# Patient Record
Sex: Female | Born: 1990 | Race: Black or African American | Hispanic: No | Marital: Single | Smoking: Former smoker
Health system: Southern US, Community
[De-identification: ages and names within clinical notes are randomized; demographics above are authoritative.]

## PROBLEM LIST (undated history)

## (undated) DIAGNOSIS — R87619 Unspecified abnormal cytological findings in specimens from cervix uteri: Secondary | ICD-10-CM

## (undated) DIAGNOSIS — A749 Chlamydial infection, unspecified: Secondary | ICD-10-CM

## (undated) DIAGNOSIS — O24419 Gestational diabetes mellitus in pregnancy, unspecified control: Secondary | ICD-10-CM

## (undated) DIAGNOSIS — IMO0002 Reserved for concepts with insufficient information to code with codable children: Secondary | ICD-10-CM

## (undated) HISTORY — DX: Gestational diabetes mellitus in pregnancy, unspecified control: O24.419

---

## 2012-04-27 ENCOUNTER — Encounter (HOSPITAL_COMMUNITY): Payer: Self-pay | Admitting: Emergency Medicine

## 2012-04-27 ENCOUNTER — Emergency Department (HOSPITAL_COMMUNITY): Payer: Self-pay

## 2012-04-27 ENCOUNTER — Emergency Department (HOSPITAL_COMMUNITY)
Admission: EM | Admit: 2012-04-27 | Discharge: 2012-04-27 | Disposition: A | Discharge status: Home Health | Payer: Self-pay | Attending: Emergency Medicine | Admitting: Emergency Medicine

## 2012-04-27 DIAGNOSIS — O9989 Other specified diseases and conditions complicating pregnancy, childbirth and the puerperium: Secondary | ICD-10-CM | POA: Insufficient documentation

## 2012-04-27 DIAGNOSIS — R1110 Vomiting, unspecified: Secondary | ICD-10-CM

## 2012-04-27 DIAGNOSIS — Z3490 Encounter for supervision of normal pregnancy, unspecified, unspecified trimester: Secondary | ICD-10-CM

## 2012-04-27 DIAGNOSIS — R109 Unspecified abdominal pain: Secondary | ICD-10-CM | POA: Insufficient documentation

## 2012-04-27 DIAGNOSIS — O219 Vomiting of pregnancy, unspecified: Secondary | ICD-10-CM | POA: Insufficient documentation

## 2012-04-27 LAB — WET PREP, GENITAL
Trich, Wet Prep: NONE SEEN
Yeast Wet Prep HPF POC: NONE SEEN

## 2012-04-27 LAB — BASIC METABOLIC PANEL
BUN: 7 mg/dL (ref 6–23)
CO2: 23 mEq/L (ref 19–32)
Calcium: 8.9 mg/dL (ref 8.4–10.5)
Chloride: 100 mEq/L (ref 96–112)
Creatinine, Ser: 0.67 mg/dL (ref 0.50–1.10)
GFR calc Af Amer: >90 mL/min (ref >90)
GFR calc non Af Amer: >90 mL/min (ref >90)
Glucose, Bld: 99 mg/dL (ref 70–99)
Potassium: 3.2 mEq/L — ABNORMAL LOW (ref 3.5–5.1)
Sodium: 136 mEq/L (ref 135–145)

## 2012-04-27 LAB — CBC
HCT: 36.7 % (ref 36.0–46.0)
Hemoglobin: 12.5 g/dL (ref 12.0–15.0)
MCH: 26.9 pg (ref 26.0–34.0)
MCHC: 34.1 g/dL (ref 30.0–36.0)
MCV: 79.1 fL (ref 78.0–100.0)
Platelets: 355 10*3/uL (ref 150–400)
RBC: 4.64 MIL/uL (ref 3.87–5.11)
RDW: 14.1 % (ref 11.5–15.5)
WBC: 10.7 10*3/uL — ABNORMAL HIGH (ref 4.0–10.5)

## 2012-04-27 LAB — URINALYSIS, ROUTINE W REFLEX MICROSCOPIC
Bilirubin Urine: NEGATIVE
Glucose, UA: NEGATIVE mg/dL
Hgb urine dipstick: NEGATIVE
Ketones, ur: 15 mg/dL — AB
Nitrite: NEGATIVE
Protein, ur: NEGATIVE mg/dL
Specific Gravity, Urine: 1.038 — ABNORMAL HIGH (ref 1.005–1.030)
Urobilinogen, UA: 1 mg/dL (ref 0.0–1.0)
pH: 7 (ref 5.0–8.0)

## 2012-04-27 LAB — URINE MICROSCOPIC-ADD ON

## 2012-04-27 LAB — POCT PREGNANCY, URINE: Preg Test, Ur: POSITIVE — AB

## 2012-04-27 MED ORDER — ONDANSETRON HCL 4 MG/2ML IJ SOLN
4.0000 mg | Freq: Once | INTRAMUSCULAR | Status: AC
  Administered 2012-04-27: 4 mg via INTRAVENOUS
  Filled 2012-04-27: qty 2

## 2012-04-27 MED ORDER — POTASSIUM CHLORIDE CRYS ER 20 MEQ PO TBCR: 40.0000 meq | EXTENDED_RELEASE_TABLET | Freq: Once | ORAL | Status: DC

## 2012-04-27 MED ORDER — PRENATAL VITAMINS 28-0.8 MG PO TABS: 1.0000 | ORAL_TABLET | Freq: Every day | ORAL | Status: DC

## 2012-04-27 MED ORDER — SODIUM CHLORIDE 0.9 % IV BOLUS (SEPSIS)
1000.0000 mL | Freq: Once | INTRAVENOUS | Status: AC
  Administered 2012-04-27: 1000 mL via INTRAVENOUS

## 2012-04-27 NOTE — ED Provider Notes (Signed)
History     CSN: [id number]  Arrival date & time 04/27/12  1301   First MD Initiated Contact with Patient 04/27/12 1620      Chief Complaint  Patient presents with  . Abdominal Pain    (Consider location/radiation/quality/duration/timing/severity/associated sxs/prior treatment) Patient is a 21 y.o. female presenting with abdominal pain. The history is provided by the patient.  Abdominal Pain The primary symptoms of the illness include abdominal pain (lower, 3 days) and vomiting (once). The primary symptoms of the illness do not include dysuria, vaginal discharge or vaginal bleeding. The current episode started more than 2 days ago. The onset of the illness was gradual. The problem has been gradually worsening.  The abdominal pain began more than 2 days ago. The pain came on gradually. The abdominal pain has been unchanged since its onset. The abdominal pain is located in the suprapubic region, RLQ and LLQ. The abdominal pain does not radiate. Relieved by: nothing. Exacerbated by: nothing.  The patient states that she believes she is currently pregnant. Symptoms associated with the illness do not include chills, urgency, hematuria, frequency or back pain.    History reviewed. No pertinent past medical history.  History reviewed. No pertinent past surgical history.  History reviewed. No pertinent family history.  History  Substance Use Topics  . Smoking status: Never Smoker   . Smokeless tobacco: Not on file  . Alcohol Use: Yes     Comment: occ    OB History    Grav Para Term Preterm Abortions TAB SAB Ect Mult Living                  Review of Systems  Constitutional: Negative for chills.  Gastrointestinal: Positive for vomiting (once) and abdominal pain (lower, 3 days).  Genitourinary: Negative for dysuria, urgency, frequency, hematuria, vaginal bleeding, vaginal discharge, difficulty urinating and vaginal pain.  Musculoskeletal: Negative for back pain.  All other  systems reviewed and are negative.    Allergies  Review of patient's allergies indicates no known allergies.  Home Medications  No current outpatient prescriptions on file.  BP 120/68  Pulse 89  Temp 98.9 F (37.2 C) (Oral)  Resp 18  SpO2 100%  Physical Exam  Nursing note and vitals reviewed. Constitutional: She is oriented to person, place, and time. She appears well-developed and well-nourished. No distress.  HENT:  Head: Normocephalic and atraumatic.  Eyes: EOM are normal. Pupils are equal, round, and reactive to light.  Neck: Normal range of motion. Neck supple.  Cardiovascular: Normal rate and regular rhythm.  Exam reveals no friction rub.   No murmur heard. Pulmonary/Chest: Effort normal and breath sounds normal. No respiratory distress. She has no wheezes. She has no rales.  Abdominal: Soft. She exhibits no distension. There is no tenderness. There is no rebound.  Genitourinary: Vagina normal. There is no rash, tenderness or lesion on the right labia. There is no rash, tenderness or lesion on the left labia. Cervix exhibits no motion tenderness, no discharge and no friability. Right adnexum displays no mass, no tenderness and no fullness. Left adnexum displays no mass, no tenderness and no fullness.  Musculoskeletal: Normal range of motion. She exhibits no edema.  Neurological: She is alert and oriented to person, place, and time.  Skin: She is not diaphoretic.    ED Course  Procedures (including critical care time)  Labs Reviewed  URINALYSIS, ROUTINE W REFLEX MICROSCOPIC - Abnormal; Notable for the following:    APPearance HAZY (*)  Specific Gravity, Urine 1.038 (*)     Ketones, ur 15 (*)     Leukocytes, UA SMALL (*)     All other components within normal limits  POCT PREGNANCY, URINE - Abnormal; Notable for the following:    Preg Test, Ur POSITIVE (*)     All other components within normal limits  URINE MICROSCOPIC-ADD ON - Abnormal; Notable for the  following:    Squamous Epithelial / LPF MANY (*)     Bacteria, UA MANY (*)     All other components within normal limits  URINE CULTURE   US Ob Comp Less 14 Wks  04/27/2012  *RADIOLOGY REPORT*  Clinical Data: Pelvic pain.  Pregnant.  OBSTETRIC <14 WK [place] AND TRANSVAGINAL OB US  Technique:  Both transabdominal and transvaginal ultrasound examinations were performed for complete evaluation of the gestation as well as the maternal uterus, adnexal regions, and pelvic cul-de-sac.  Transvaginal technique was performed to assess early pregnancy.  Comparison:  None.  Intrauterine gestational sac:  Visualized/normal in shape. Yolk sac: Present Embryo: Present Cardiac Activity: Present Heart Rate: 110 bpm  CRL: 4.1  mm  6 w  1 d        [place] EDC: 12/20/2012.  Maternal uterus/adnexae: Small subchorionic hemorrhage is present.  Physiologic appearance of the ovaries bilaterally.  Small amount of free fluid.  IMPRESSION: Single viable intrauterine pregnancy with estimated gestational age [redacted] weeks 1 day.  Small subchorionic hemorrhage.   Original Report Authenticated By: Andreas Newport, M.D.    US Ob Transvaginal  04/27/2012  *RADIOLOGY REPORT*  Clinical Data: Pelvic pain.  Pregnant.  OBSTETRIC <14 WK [place] AND TRANSVAGINAL OB US  Technique:  Both transabdominal and transvaginal ultrasound examinations were performed for complete evaluation of the gestation as well as the maternal uterus, adnexal regions, and pelvic cul-de-sac.  Transvaginal technique was performed to assess early pregnancy.  Comparison:  None.  Intrauterine gestational sac:  Visualized/normal in shape. Yolk sac: Present Embryo: Present Cardiac Activity: Present Heart Rate: 110 bpm  CRL: 4.1  mm  6 w  1 d        [place] EDC: 12/20/2012.  Maternal uterus/adnexae: Small subchorionic hemorrhage is present.  Physiologic appearance of the ovaries bilaterally.  Small amount of free fluid.  IMPRESSION: Single viable intrauterine pregnancy with estimated gestational age [redacted]  weeks 1 day.  Small subchorionic hemorrhage.   Original Report Authenticated By: Andreas Newport, M.D.      1. Abdominal  pain, other specified site   2. Pregnancy   3. Vomiting       MDM   21 year old female with no pressure presents with lower abdominal pain for several days. She's had some vomiting today. Multiple contacts have been sick with a GI illness including her cousin/roommate and her boyfriend. Patient's LMP was around November 1. Today she is pregnant on our pregnancy test. Belly is benign except for some mild lower quadrant tenderness. On pelvic exam there is no tenderness, no vaginal discharge, no adnexal tenderness. I was unable to visualize a gravid uterus with a transabdominal ultrasound probe. I will send her for a formal ultrasound to stage her pregnancy.  I believe patient's vomiting could be due to early pregnancy or the GI illness or roommate's. Patient will be given fluids and basic labs to be checked.  Her urine appears contaminated, denies dysuria at this time. Labs with mild hypokalemia - repleted PO. [place] with IUP at 6 weeks and 1 day. Patient tolerating PO well, relaxing  comfortably. Given Rx for PNV and instructions for prenatal follow. Stable for discharge.    Elwin Mocha, MD 04/27/12 [phone]

## 2012-04-27 NOTE — ED Notes (Signed)
Patient transported to Ultrasound 

## 2012-04-27 NOTE — ED Notes (Signed)
Pt c/o lower abd pain x several days; pt sts LMP was around first on November; pt sts she thinks she could be pregnant; pt denies discharge or bleeding; pt denies UTI sx

## 2012-04-27 NOTE — ED Notes (Signed)
Patient to us at this time.

## 2012-04-28 LAB — URINE CULTURE

## 2012-04-29 LAB — GC/CHLAMYDIA PROBE AMP
CT Probe RNA: POSITIVE — AB
GC Probe RNA: NEGATIVE

## 2012-04-30 NOTE — ED Notes (Signed)
+  Chlamydia. Chart sent to EDP office for review. [employer] attached. 

## 2012-05-02 NOTE — ED Provider Notes (Signed)
I saw and evaluated the patient, reviewed the resident's note and I agree with the findings and plan.  20yF with abdominal pain. 1st trimester pregnancy. On exam, NAD. Lungs clear. RRR. No murmur. Abdomen benign. Skin warm and dry. US shows IUP @[redacted]w[redacted]d . Prenatal vitamins. Ob fu. Return precautions discussed.  Raeford Razor, MD 05/02/12 0005

## 2012-05-06 ENCOUNTER — Telehealth (HOSPITAL_COMMUNITY): Payer: Self-pay | Admitting: Emergency Medicine

## 2012-05-06 NOTE — ED Notes (Signed)
Rxs called in to [facility] on [address] by Norm Parcel PFM.

## 2012-05-06 NOTE — ED Notes (Signed)
Chart returned from EDP office. Prescribed Azithromycin 1 gram PO x 1 and Metronidazole 500 mg PO BID 7 days. Prescribed by Roxy Horseman PA-C.

## 2012-05-17 NOTE — L&D Delivery Note (Signed)
Delivery Note At 8:00 PM a viable female was delivered via Vaginal, Spontaneous Delivery (Presentation: Right Occiput Anterior).  APGAR: 9, 9; weight: pending Placenta status: Intact, Spontaneous.  Cord: 3 vessels with the following complications: None.    Anesthesia: Epidural  Episiotomy: None Lacerations: Periurethral (hemostatic) Suture Repair: n/a Est. Blood Loss (mL): 150  Mother pushed for 25 mins to deliver viable female infant over intact perineum in ROA position. No nuchal cord. Shoulders and body delivered without difficulty or delay. + meconium stained fluid. Baby immediately vigorous and crying upon delivery and placed on maternal abdomen. Cord clamping delayed until umbilical cord stopped pulsing. Cord then clamped and cut by FOB.  Third stage of labor actively managed with gentle cord traction and Oxytocin. Placenta delivered intact with 3 vessel cord. No perineal lacerations. Small right periurethral laceration however it was hemostatic and no repair was required. Mother and infant stable at conclusion of delivery. Counts correct and verified. Mom to postpartum. Infant to couplet care/skin to skin.   Delivery performed by me with direct supervision by Wynelle Bourgeois, CNM  C[place]art, Ryann 03/30/2013, 8:29 PM  Attended by me also Agree with note Aviva Signs, CNM

## 2012-08-22 ENCOUNTER — Encounter (HOSPITAL_COMMUNITY): Payer: Self-pay | Admitting: Physical Medicine and Rehabilitation

## 2012-08-22 ENCOUNTER — Emergency Department (HOSPITAL_COMMUNITY)
Admission: EM | Admit: 2012-08-22 | Discharge: 2012-08-22 | Disposition: A | Discharge status: Home / Self Care | Payer: Medicaid Other | Attending: Emergency Medicine | Admitting: Emergency Medicine

## 2012-08-22 ENCOUNTER — Emergency Department (HOSPITAL_COMMUNITY): Payer: Medicaid Other

## 2012-08-22 DIAGNOSIS — R350 Frequency of micturition: Secondary | ICD-10-CM | POA: Insufficient documentation

## 2012-08-22 DIAGNOSIS — R197 Diarrhea, unspecified: Secondary | ICD-10-CM | POA: Insufficient documentation

## 2012-08-22 DIAGNOSIS — R109 Unspecified abdominal pain: Secondary | ICD-10-CM

## 2012-08-22 DIAGNOSIS — R82998 Other abnormal findings in urine: Secondary | ICD-10-CM | POA: Insufficient documentation

## 2012-08-22 DIAGNOSIS — Z3490 Encounter for supervision of normal pregnancy, unspecified, unspecified trimester: Secondary | ICD-10-CM

## 2012-08-22 DIAGNOSIS — O219 Vomiting of pregnancy, unspecified: Secondary | ICD-10-CM | POA: Insufficient documentation

## 2012-08-22 DIAGNOSIS — O9989 Other specified diseases and conditions complicating pregnancy, childbirth and the puerperium: Secondary | ICD-10-CM | POA: Insufficient documentation

## 2012-08-22 DIAGNOSIS — R8271 Bacteriuria: Secondary | ICD-10-CM

## 2012-08-22 DIAGNOSIS — R1110 Vomiting, unspecified: Secondary | ICD-10-CM

## 2012-08-22 DIAGNOSIS — M436 Torticollis: Secondary | ICD-10-CM | POA: Insufficient documentation

## 2012-08-22 DIAGNOSIS — R1013 Epigastric pain: Secondary | ICD-10-CM | POA: Insufficient documentation

## 2012-08-22 LAB — COMPREHENSIVE METABOLIC PANEL
ALT: 7 U/L (ref 0–35)
AST: 14 U/L (ref 0–37)
Albumin: 3.3 g/dL — ABNORMAL LOW (ref 3.5–5.2)
CO2: 23 mEq/L (ref 19–32)
Calcium: 9.1 mg/dL (ref 8.4–10.5)
Chloride: 100 mEq/L (ref 96–112)
GFR calc non Af Amer: >90 mL/min (ref >90)
Sodium: 134 mEq/L — ABNORMAL LOW (ref 135–145)

## 2012-08-22 LAB — CBC WITH DIFFERENTIAL/PLATELET
Basophils Absolute: 0 10*3/uL (ref 0.0–0.1)
Basophils Relative: 0 % (ref 0–1)
Eosinophils Relative: 1 % (ref 0–5)
Lymphocytes Relative: 23 % (ref 12–46)
MCHC: 34.5 g/dL (ref 30.0–36.0)
Neutro Abs: 6.7 10*3/uL (ref 1.7–7.7)
Platelets: 342 10*3/uL (ref 150–400)
RDW: 14.4 % (ref 11.5–15.5)
WBC: 9.4 10*3/uL (ref 4.0–10.5)

## 2012-08-22 LAB — URINALYSIS, ROUTINE W REFLEX MICROSCOPIC
Bilirubin Urine: NEGATIVE
Glucose, UA: NEGATIVE mg/dL
Hgb urine dipstick: NEGATIVE
Specific Gravity, Urine: 1.033 — ABNORMAL HIGH (ref 1.005–1.030)
Urobilinogen, UA: 1 mg/dL (ref 0.0–1.0)

## 2012-08-22 LAB — WET PREP, GENITAL
Clue Cells Wet Prep HPF POC: NONE SEEN
Trich, Wet Prep: NONE SEEN

## 2012-08-22 LAB — URINE MICROSCOPIC-ADD ON

## 2012-08-22 LAB — LIPASE, BLOOD: Lipase: 37 U/L (ref 11–59)

## 2012-08-22 MED ORDER — LIDOCAINE HCL (PF) 1 % IJ SOLN
1.0000 mL | Freq: Once | INTRAMUSCULAR | Status: AC
  Administered 2012-08-22: 1 mL

## 2012-08-22 MED ORDER — AZITHROMYCIN 1 G PO PACK
1.0000 g | PACK | Freq: Once | ORAL | Status: AC
  Administered 2012-08-22: 1 g via ORAL
  Filled 2012-08-22: qty 1

## 2012-08-22 MED ORDER — CEPHALEXIN 500 MG PO CAPS: 500.0000 mg | ORAL_CAPSULE | Freq: Two times a day (BID) | ORAL | Status: DC

## 2012-08-22 MED ORDER — CEFTRIAXONE SODIUM 250 MG IJ SOLR
250.0000 mg | Freq: Once | INTRAMUSCULAR | Status: AC
  Administered 2012-08-22: 250 mg via INTRAMUSCULAR
  Filled 2012-08-22: qty 250

## 2012-08-22 MED ORDER — LIDOCAINE HCL (PF) 1 % IJ SOLN
INTRAMUSCULAR | Status: AC
  Filled 2012-08-22: qty 5

## 2012-08-22 NOTE — ED Notes (Signed)
Pt presents to department for evaluation of diffuse abdominal pain, and N/V/D. Ongoing x3 days. 7/10 pain at the time. Also states vaginal discharge and urinary frequency. Pt is alert and oriented x4. No signs of distress noted.

## 2012-08-22 NOTE — ED Provider Notes (Signed)
I saw and evaluated the patient, reviewed the resident's note and I agree with the findings and plan. On my exam this female with her second pregnancy, no prenatal evaluation thus far is in no distress.  Gerhard Munch, MD 08/22/12 1525

## 2012-08-22 NOTE — ED Provider Notes (Signed)
History     CSN: [id number]  Arrival date & time 08/22/12  4098   First MD Initiated Contact with Patient 08/22/12 [phone]      Chief Complaint  Patient presents with  . Nausea  . Emesis  . Abdominal Pain    (Consider location/radiation/quality/duration/timing/severity/associated sxs/prior treatment) Patient is a 22 y.o. female presenting with abdominal pain.  Abdominal Pain Pain location:  Epigastric Pain quality: aching   Pain radiates to:  RUQ Pain severity:  Moderate Onset quality:  Gradual Duration:  3 days Timing:  Constant Progression:  Worsening Chronicity:  New Context: not eating   Relieved by:  Eating Ineffective treatments:  Acetaminophen and NSAIDs Associated symptoms: diarrhea (now improving), nausea, vaginal discharge and vomiting   Associated symptoms: no chest pain, no cough, no fever and no shortness of breath   Vaginal discharge:    Quality:  White   Duration: started this morning. Vomiting:    Number of occurrences:  3 times per day   No past medical history on file.  No past surgical history on file.  No family history on file.  History  Substance Use Topics  . Smoking status: Never Smoker   . Smokeless tobacco: Not on file  . Alcohol Use: Yes     Comment: occ    OB History   Grav Para Term Preterm Abortions TAB SAB Ect Mult Living                  Review of Systems  Constitutional: Negative for fever.  HENT: Negative for congestion.   Respiratory: Negative for cough and shortness of breath.   Cardiovascular: Negative for chest pain.  Gastrointestinal: Positive for nausea, vomiting, abdominal pain and diarrhea (now improving).  Genitourinary: Positive for frequency and vaginal discharge. Negative for difficulty urinating, vaginal pain and pelvic pain.  All other systems reviewed and are negative.    Allergies  Review of patient's allergies indicates no known allergies.  Home Medications  No current outpatient prescriptions on  file.  BP 130/74  Pulse 82  Temp(Src) 98.1 F (36.7 C) (Oral)  SpO2 99%  Physical Exam  Nursing note and vitals reviewed. Constitutional: She is oriented to person, place, and time. She appears well-developed and well-nourished. No distress.  HENT:  Head: Normocephalic and atraumatic.  Mouth/Throat: Oropharynx is clear and moist.  Eyes: Conjunctivae are normal. Pupils are equal, round, and reactive to light. No scleral icterus.  Neck: Neck supple.  Cardiovascular: Normal rate, regular rhythm, normal heart sounds and intact distal pulses.   No murmur heard. Pulmonary/Chest: Effort normal and breath sounds normal. No stridor. No respiratory distress. She has no rales.  Abdominal: Soft. Bowel sounds are normal. She exhibits no distension. There is tenderness in the right upper quadrant and epigastric area. There is no rigidity, no rebound, no guarding and no CVA tenderness.  Genitourinary: There is no lesion on the right labia. There is no lesion on the left labia. Uterus is not tender. Cervix exhibits discharge (scant white). Cervix exhibits no motion tenderness and no friability. Right adnexum displays tenderness. Right adnexum displays no mass. Left adnexum displays tenderness. Left adnexum displays no mass.  Musculoskeletal: Normal range of motion.  Neurological: She is alert and oriented to person, place, and time.  Skin: Skin is warm and dry. No rash noted.  Psychiatric: She has a normal mood and affect. Her behavior is normal.    ED Course  Procedures (including critical care time)  Labs Reviewed  CBC  WITH DIFFERENTIAL - Abnormal; Notable for the following:    MCV 77.4 (*)    All other components within normal limits  COMPREHENSIVE METABOLIC PANEL - Abnormal; Notable for the following:    Sodium 134 (*)    Glucose, Bld 134 (*)    Albumin 3.3 (*)    All other components within normal limits  URINALYSIS, ROUTINE W REFLEX MICROSCOPIC - Abnormal; Notable for the following:     APPearance CLOUDY (*)    Specific Gravity, Urine 1.033 (*)    Leukocytes, UA MODERATE (*)    All other components within normal limits  URINE MICROSCOPIC-ADD ON - Abnormal; Notable for the following:    Squamous Epithelial / LPF MANY (*)    Bacteria, UA FEW (*)    All other components within normal limits  POCT PREGNANCY, URINE - Abnormal; Notable for the following:    Preg Test, Ur POSITIVE (*)    All other components within normal limits  GC/CHLAMYDIA PROBE AMP  WET PREP, GENITAL  URINE CULTURE  LIPASE, BLOOD   US Ob Comp Less 14 Wks  08/22/2012  *RADIOLOGY REPORT*  Clinical Data: Early OB and lower abdominal pain.  OBSTETRIC <14 WK [place] AND TRANSVAGINAL OB US  Technique:  Both transabdominal and transvaginal ultrasound examinations were performed for complete evaluation of the gestation as well as the maternal uterus, adnexal regions, and pelvic cul-de-sac.  Transvaginal technique was performed to assess early pregnancy.  Comparison:  04/27/2012  Intrauterine gestational sac:  Visualized/normal in shape. Yolk sac: Present Embryo: Present Cardiac Activity: Present Heart Rate: 163 bpm CRL: 17.9  mm  8 w  2 d             [place] EDC: 04/01/2013  Maternal uterus/adnexae: Normal appearance of both ovaries. There may be a corpus luteum on the left, measuring up to 1.8 cm.  The patient has a retroflexed uterus.  There is suggestion for a small subchorionic hemorrhage. No free fluid.  IMPRESSION: Single live intrauterine pregnancy. Calculated gestational age by ultrasound is 8 weeks and 2 days.   Original Report Authenticated By: Richarda Overlie, M.D.    US Ob Transvaginal  08/22/2012  *RADIOLOGY REPORT*  Clinical Data: Early OB and lower abdominal pain.  OBSTETRIC <14 WK [place] AND TRANSVAGINAL OB US  Technique:  Both transabdominal and transvaginal ultrasound examinations were performed for complete evaluation of the gestation as well as the maternal uterus, adnexal regions, and pelvic cul-de-sac.  Transvaginal  technique was performed to assess early pregnancy.  Comparison:  04/27/2012  Intrauterine gestational sac:  Visualized/normal in shape. Yolk sac: Present Embryo: Present Cardiac Activity: Present Heart Rate: 163 bpm CRL: 17.9  mm  8 w  2 d             [place] EDC: 04/01/2013  Maternal uterus/adnexae: Normal appearance of both ovaries. There may be a corpus luteum on the left, measuring up to 1.8 cm.  The patient has a retroflexed uterus.  There is suggestion for a small subchorionic hemorrhage. No free fluid.  IMPRESSION: Single live intrauterine pregnancy. Calculated gestational age by ultrasound is 8 weeks and 2 days.   Original Report Authenticated By: Richarda Overlie, M.D.      1. Pregnancy   2. Emesis   3. Bacteriuria   4. Abdominal pain       MDM   22 yo female presenting with epigastric abdominal pain and vomiting for 3 days.  Vaginal discharge just started today.  No pelvic pain.  Well appearing, abd soft, tender in epigastrium and RUQ.  Unlikely biliary (gets better with food and epigastric focus).  Unlikely Fitz-Hugh-Curtis based on well appearance, abdominal exam, and timeline of illness.    9:48 AM Pt has positive pregnancy test. On further history, she was seen here 4.5 months ago, at which time she was found to be pregnant.  She subsequently miscarried, and received her obstetrical care at the [facility].  She recalls being put on an antibiotic at this time, but is unsure of the name and did not complete course secondary to GI upset.  Of note, she had a positive chlamydia test back in December, but states she was unaware of these results.  Unclear whether this is what she was being treated for with the antibiotics aforementioned.  Pelvic exam is not consistent with PID, but plan to treat for GC/Chlamydia given her pregnancy, recent positive test, current discharge, and unclear prior treatment.  Also plan formal obstetrical ultrasound to rule out ectopic, given her previous STI's and  her adnexal tenderness.    12:33 PM US shows live intrauterine pregnancy.  Will dc with keflex for bacteriuria and pyuria.      Rennis Petty, MD 08/22/12 1414

## 2012-08-22 NOTE — ED Notes (Signed)
Pt transported to ultrasound.

## 2012-08-23 LAB — GC/CHLAMYDIA PROBE AMP
CT Probe RNA: NEGATIVE
GC Probe RNA: NEGATIVE

## 2012-08-23 LAB — URINE CULTURE: Colony Count: 35000

## 2012-11-20 LAB — OB RESULTS CONSOLE ANTIBODY SCREEN: Antibody Screen: NEGATIVE

## 2012-11-20 LAB — OB RESULTS CONSOLE ABO/RH

## 2012-12-04 LAB — OB RESULTS CONSOLE RUBELLA ANTIBODY, IGM: Rubella: IMMUNE

## 2012-12-04 LAB — OB RESULTS CONSOLE HEPATITIS B SURFACE ANTIGEN: Hepatitis B Surface Ag: NEGATIVE

## 2012-12-04 LAB — OB RESULTS CONSOLE HIV ANTIBODY (ROUTINE TESTING): HIV: NONREACTIVE

## 2012-12-04 LAB — OB RESULTS CONSOLE RPR: RPR: NONREACTIVE

## 2013-03-12 ENCOUNTER — Encounter: Payer: Self-pay | Admitting: Obstetrics and Gynecology

## 2013-03-12 ENCOUNTER — Ambulatory Visit (INDEPENDENT_AMBULATORY_CARE_PROVIDER_SITE_OTHER): Payer: Medicaid Other | Admitting: Obstetrics and Gynecology

## 2013-03-12 ENCOUNTER — Encounter: Payer: Medicaid Other | Attending: Obstetrics and Gynecology | Admitting: *Deleted

## 2013-03-12 VITALS — BP 123/81 | Temp 98.7°F | Ht 67.0 in | Wt 251.7 lb

## 2013-03-12 DIAGNOSIS — Z713 Dietary counseling and surveillance: Secondary | ICD-10-CM | POA: Insufficient documentation

## 2013-03-12 DIAGNOSIS — O24419 Gestational diabetes mellitus in pregnancy, unspecified control: Secondary | ICD-10-CM

## 2013-03-12 DIAGNOSIS — O0993 Supervision of high risk pregnancy, unspecified, third trimester: Secondary | ICD-10-CM

## 2013-03-12 DIAGNOSIS — Z23 Encounter for immunization: Secondary | ICD-10-CM

## 2013-03-12 DIAGNOSIS — O99810 Abnormal glucose complicating pregnancy: Secondary | ICD-10-CM

## 2013-03-12 LAB — POCT URINALYSIS DIP (DEVICE)
Hgb urine dipstick: NEGATIVE
Nitrite: NEGATIVE
Protein, ur: NEGATIVE mg/dL
pH: 6.5 (ref 5.0–8.0)

## 2013-03-12 MED ORDER — GLUCOSE BLOOD VI STRP: ORAL_STRIP | Status: DC

## 2013-03-12 MED ORDER — ACCU-CHEK NANO SMARTVIEW W/DEVICE KIT: 1.0000 | PACK | Freq: Four times a day (QID) | Status: DC

## 2013-03-12 MED ORDER — TETANUS-DIPHTH-ACELL PERTUSSIS 5-2.5-18.5 LF-MCG/0.5 IM SUSP
0.5000 mL | Freq: Once | INTRAMUSCULAR | Status: AC
  Administered 2013-03-12: 0.5 mL via INTRAMUSCULAR

## 2013-03-12 MED ORDER — ACCU-CHEK FASTCLIX LANCETS MISC: 1.0000 [IU] | Freq: Four times a day (QID) | Status: DC

## 2013-03-12 NOTE — Progress Notes (Signed)
DIABETES:  Patient presents as new pt to clinic and new DX GDM  Patient was seen on 03/12/13 for Gestational Diabetes self-management education. The following learning objectives were met by the patient during this visit:   States the definition of Gestational Diabetes  States why dietary management is important in controlling blood glucose  States when to check blood glucose levels  Demonstrates proper blood glucose monitoring techniques  States the effect of stress and exercise on blood glucose levels  States the importance of limiting caffeine and abstaining from alcohol and smoking Plan:  Consider  increasing your activity level by walking daily as tolerated Begin checking BG before breakfast and 1-2 hours after first bite of breakfast, lunch and dinner after  as directed by MD  Take medication  as directed by MD  Blood glucose monitor given: Accu Chek Nano BG Monitoring Kit Lot # [id number] Exp: 04/15/14 Blood glucose reading: 116  Patient instructed to monitor glucose levels: FBS: 60 - <90 1 hour: <140 2 hour: <120  Patient will be seen for follow-up as needed.

## 2013-03-12 NOTE — Progress Notes (Signed)
Pulse- 79  Pressure-"top of vagina when I trying to get out of bed or stand up" Weight gain of 11-20lb Flu and tdap vaccine consented and info given

## 2013-03-12 NOTE — Progress Notes (Signed)
Ultrasound scheduled for 03/14/13 at 8:45 am.

## 2013-03-12 NOTE — Addendum Note (Signed)
Addended by: Franchot Mimes on: 03/12/2013 01:34 PM   Modules accepted: Orders

## 2013-03-12 NOTE — Progress Notes (Signed)
Patient transferred from HD secondary to diagnosis of GDM. No records are available for review. Patient states onset of care with HD at 3 months, never missed any appointment. States she took glucola test twice and failed 3 hr test. Patient without complaints today.  Will start diabetic teaching. Will schedule growth ultrasound at 38 weeks. Cultures collected today

## 2013-03-12 NOTE — Progress Notes (Signed)
Nutrition note: 1st visit consult & GDM diet education Pt has h/o obesity & is a newly diagnosed GDM pt Pt has gained 13.7# @ [redacted]w[redacted]d, which is wnl. Pt reports eating 3 meals & 2-4 snacks/d. Pt is taking PNV. Pt reports no N/V or heartburn. NKFA. Pt reports walking daily. Pt received verbal & written education on GDM diet.  Discussed wt gain goals of 11-20# or 0.5#/wk. Pt agrees to follow GDM diet with 3 meals & 3 snacks/d with proper CHO/ protein combination. Pt has WIC and plans to BF. F/u in 2-4 wks Blondell Reveal, MS, RD, LDN

## 2013-03-13 LAB — PRESCRIPTION MONITORING PROFILE (19 PANEL)
Amphetamine/Meth: NEGATIVE ng/mL
Benzodiazepine Screen, Urine: NEGATIVE ng/mL
Buprenorphine, Urine: NEGATIVE ng/mL
Carisoprodol, Urine: NEGATIVE ng/mL
MDMA URINE: NEGATIVE ng/mL
Meperidine, Ur: NEGATIVE ng/mL
Methadone Screen, Urine: NEGATIVE ng/mL
Nitrites, Initial: NEGATIVE ug/mL
Propoxyphene: NEGATIVE ng/mL
Tapentadol, urine: NEGATIVE ng/mL
Tramadol Scrn, Ur: NEGATIVE ng/mL
pH, Initial: 6.7 pH (ref 4.5–8.9)

## 2013-03-13 LAB — GC/CHLAMYDIA PROBE AMP
CT Probe RNA: NEGATIVE
GC Probe RNA: NEGATIVE

## 2013-03-13 LAB — ALCOHOL METABOLITE (ETG), URINE: Ethyl Glucuronide (EtG): NEGATIVE ng/mL

## 2013-03-14 ENCOUNTER — Other Ambulatory Visit: Payer: Self-pay | Admitting: Obstetrics and Gynecology

## 2013-03-14 ENCOUNTER — Encounter: Payer: Self-pay | Admitting: *Deleted

## 2013-03-14 ENCOUNTER — Ambulatory Visit (HOSPITAL_COMMUNITY)
Admission: RE | Admit: 2013-03-14 | Discharge: 2013-03-14 | Disposition: A | Discharge status: Home / Self Care | Payer: Medicaid Other | Source: Ambulatory Visit | Attending: Obstetrics and Gynecology | Admitting: Obstetrics and Gynecology

## 2013-03-14 DIAGNOSIS — O0930 Supervision of pregnancy with insufficient antenatal care, unspecified trimester: Secondary | ICD-10-CM | POA: Insufficient documentation

## 2013-03-14 DIAGNOSIS — O99810 Abnormal glucose complicating pregnancy: Secondary | ICD-10-CM | POA: Insufficient documentation

## 2013-03-14 DIAGNOSIS — O0993 Supervision of high risk pregnancy, unspecified, third trimester: Secondary | ICD-10-CM

## 2013-03-14 DIAGNOSIS — Z3689 Encounter for other specified antenatal screening: Secondary | ICD-10-CM | POA: Insufficient documentation

## 2013-03-16 ENCOUNTER — Encounter: Payer: Self-pay | Admitting: Obstetrics and Gynecology

## 2013-03-16 DIAGNOSIS — O99820 Streptococcus B carrier state complicating pregnancy: Secondary | ICD-10-CM | POA: Insufficient documentation

## 2013-03-18 ENCOUNTER — Encounter: Payer: Self-pay | Admitting: Obstetrics and Gynecology

## 2013-03-19 ENCOUNTER — Ambulatory Visit (INDEPENDENT_AMBULATORY_CARE_PROVIDER_SITE_OTHER): Payer: Medicaid Other | Admitting: Obstetrics & Gynecology

## 2013-03-19 VITALS — BP 119/83 | Temp 97.6°F | Wt 251.9 lb

## 2013-03-19 DIAGNOSIS — O0993 Supervision of high risk pregnancy, unspecified, third trimester: Secondary | ICD-10-CM

## 2013-03-19 DIAGNOSIS — O99820 Streptococcus B carrier state complicating pregnancy: Secondary | ICD-10-CM

## 2013-03-19 DIAGNOSIS — O24419 Gestational diabetes mellitus in pregnancy, unspecified control: Secondary | ICD-10-CM

## 2013-03-19 DIAGNOSIS — Z22330 Carrier of Group B streptococcus: Secondary | ICD-10-CM

## 2013-03-19 DIAGNOSIS — O09899 Supervision of other high risk pregnancies, unspecified trimester: Secondary | ICD-10-CM

## 2013-03-19 DIAGNOSIS — O99810 Abnormal glucose complicating pregnancy: Secondary | ICD-10-CM

## 2013-03-19 LAB — POCT URINALYSIS DIP (DEVICE)
Bilirubin Urine: NEGATIVE
Glucose, UA: NEGATIVE mg/dL
Nitrite: NEGATIVE
Urobilinogen, UA: 0.2 mg/dL (ref 0.0–1.0)

## 2013-03-19 NOTE — Progress Notes (Signed)
Fastings CBG 92-97; 2 hr PP 93,116, 140 L 108, 104, 106, dinner not recorded. Scant values.  Emphasized importance of checking and recording CBG to decrease maternal-fetal morbidity and mortality.   U/S on 03/14/13  [redacted]w[redacted]d EFW 2646 g (5-13) /24%, AFI 12.86, cephalic.  No other complaints or concerns.  Fetal movement and labor precautions reviewed.

## 2013-03-19 NOTE — Patient Instructions (Signed)
Return to clinic for any obstetric concerns or go to [facility] for evaluation  

## 2013-03-19 NOTE — Progress Notes (Signed)
P= 94 Pt. C/o of pelvic pressure especially at night.

## 2013-03-26 ENCOUNTER — Encounter: Payer: Medicaid Other | Admitting: Family Medicine

## 2013-03-29 ENCOUNTER — Encounter: Payer: Self-pay | Admitting: Obstetrics and Gynecology

## 2013-03-29 ENCOUNTER — Ambulatory Visit (INDEPENDENT_AMBULATORY_CARE_PROVIDER_SITE_OTHER): Payer: Medicaid Other | Admitting: Obstetrics and Gynecology

## 2013-03-29 ENCOUNTER — Telehealth (HOSPITAL_COMMUNITY): Payer: Self-pay | Admitting: *Deleted

## 2013-03-29 VITALS — BP 124/85 | Temp 97.3°F | Wt 258.3 lb

## 2013-03-29 DIAGNOSIS — O24419 Gestational diabetes mellitus in pregnancy, unspecified control: Secondary | ICD-10-CM

## 2013-03-29 DIAGNOSIS — O0993 Supervision of high risk pregnancy, unspecified, third trimester: Secondary | ICD-10-CM

## 2013-03-29 DIAGNOSIS — O99810 Abnormal glucose complicating pregnancy: Secondary | ICD-10-CM

## 2013-03-29 DIAGNOSIS — O99820 Streptococcus B carrier state complicating pregnancy: Secondary | ICD-10-CM

## 2013-03-29 DIAGNOSIS — O09899 Supervision of other high risk pregnancies, unspecified trimester: Secondary | ICD-10-CM

## 2013-03-29 DIAGNOSIS — Z22330 Carrier of Group B streptococcus: Secondary | ICD-10-CM

## 2013-03-29 LAB — POCT URINALYSIS DIP (DEVICE)
Bilirubin Urine: NEGATIVE
Glucose, UA: NEGATIVE mg/dL
Ketones, ur: NEGATIVE mg/dL
Nitrite: NEGATIVE

## 2013-03-29 NOTE — Telephone Encounter (Signed)
Preadmission screen  

## 2013-03-29 NOTE — Progress Notes (Signed)
Pulse- 102 Patient reports pelvic pressure

## 2013-03-29 NOTE — Progress Notes (Signed)
Patient doing well without complaints. FM/Labor precautions reviewed. Will schedule IOL on 11/16 at 40 weeks.  Fasting 92-97   2 hr pp 101-140 (2 values out of range). Patient admits to eating in the middle of the night. Advised to consume protein rich snacks to prevent nighttime eating and help with fasting CBGs

## 2013-03-29 NOTE — Addendum Note (Signed)
Addended by: Jill Side on: 03/29/2013 03:07 PM   Modules accepted: Orders

## 2013-03-29 NOTE — Progress Notes (Signed)
IOL scheduled 04/01/13 at 730 am.

## 2013-03-30 ENCOUNTER — Encounter (HOSPITAL_COMMUNITY): Payer: Medicaid Other | Admitting: Anesthesiology

## 2013-03-30 ENCOUNTER — Encounter: Payer: Self-pay | Admitting: *Deleted

## 2013-03-30 ENCOUNTER — Inpatient Hospital Stay (HOSPITAL_COMMUNITY): Payer: Medicaid Other | Admitting: Anesthesiology

## 2013-03-30 ENCOUNTER — Encounter (HOSPITAL_COMMUNITY): Payer: Self-pay | Admitting: *Deleted

## 2013-03-30 ENCOUNTER — Inpatient Hospital Stay (HOSPITAL_COMMUNITY)
Admission: AD | Admit: 2013-03-30 | Discharge: 2013-04-01 | DRG: 775 | Disposition: A | Discharge status: Home / Self Care | Payer: Medicaid Other | Source: Ambulatory Visit | Attending: Obstetrics and Gynecology | Admitting: Obstetrics and Gynecology

## 2013-03-30 DIAGNOSIS — O99892 Other specified diseases and conditions complicating childbirth: Secondary | ICD-10-CM | POA: Diagnosis present

## 2013-03-30 DIAGNOSIS — O99814 Abnormal glucose complicating childbirth: Principal | ICD-10-CM | POA: Diagnosis present

## 2013-03-30 DIAGNOSIS — O9989 Other specified diseases and conditions complicating pregnancy, childbirth and the puerperium: Secondary | ICD-10-CM

## 2013-03-30 DIAGNOSIS — Z22330 Carrier of Group B streptococcus: Secondary | ICD-10-CM

## 2013-03-30 HISTORY — DX: Reserved for concepts with insufficient information to code with codable children: IMO0002

## 2013-03-30 HISTORY — DX: Chlamydial infection, unspecified: A74.9

## 2013-03-30 HISTORY — DX: Unspecified abnormal cytological findings in specimens from cervix uteri: R87.619

## 2013-03-30 LAB — CBC
HCT: 37.9 % (ref 36.0–46.0)
Hemoglobin: 13 g/dL (ref 12.0–15.0)
MCH: 26.5 pg (ref 26.0–34.0)
MCV: 77.2 fL — ABNORMAL LOW (ref 78.0–100.0)
RBC: 4.91 MIL/uL (ref 3.87–5.11)
RDW: 14.7 % (ref 11.5–15.5)

## 2013-03-30 LAB — GLUCOSE, CAPILLARY: Glucose-Capillary: 129 mg/dL — ABNORMAL HIGH (ref 70–99)

## 2013-03-30 LAB — TYPE AND SCREEN: Antibody Screen: NEGATIVE

## 2013-03-30 LAB — ABO/RH: ABO/RH(D): O POS

## 2013-03-30 MED ORDER — DIPHENHYDRAMINE HCL 50 MG/ML IJ SOLN: 12.5000 mg | INTRAMUSCULAR | Status: DC | PRN

## 2013-03-30 MED ORDER — IBUPROFEN 600 MG PO TABS
600.0000 mg | ORAL_TABLET | Freq: Four times a day (QID) | ORAL | Status: DC
  Administered 2013-03-30 – 2013-04-01 (×6): 600 mg via ORAL
  Filled 2013-03-30 (×6): qty 1

## 2013-03-30 MED ORDER — TETANUS-DIPHTH-ACELL PERTUSSIS 5-2.5-18.5 LF-MCG/0.5 IM SUSP: 0.5000 mL | Freq: Once | INTRAMUSCULAR | Status: DC

## 2013-03-30 MED ORDER — EPHEDRINE 5 MG/ML INJ
10.0000 mg | INTRAVENOUS | Status: DC | PRN
  Filled 2013-03-30: qty 2

## 2013-03-30 MED ORDER — SENNOSIDES-DOCUSATE SODIUM 8.6-50 MG PO TABS
2.0000 | ORAL_TABLET | ORAL | Status: DC
  Administered 2013-03-30 – 2013-04-01 (×2): 2 via ORAL
  Filled 2013-03-30 (×2): qty 2

## 2013-03-30 MED ORDER — DIBUCAINE 1 % RE OINT: 1.0000 "application " | TOPICAL_OINTMENT | RECTAL | Status: DC | PRN

## 2013-03-30 MED ORDER — EPHEDRINE 5 MG/ML INJ
10.0000 mg | INTRAVENOUS | Status: DC | PRN
  Filled 2013-03-30: qty 4
  Filled 2013-03-30: qty 2

## 2013-03-30 MED ORDER — LACTATED RINGERS IV SOLN
INTRAVENOUS | Status: DC
  Administered 2013-03-30 (×2): via INTRAVENOUS

## 2013-03-30 MED ORDER — PHENYLEPHRINE 40 MCG/ML (10ML) SYRINGE FOR IV PUSH (FOR BLOOD PRESSURE SUPPORT)
80.0000 ug | PREFILLED_SYRINGE | INTRAVENOUS | Status: DC | PRN
  Filled 2013-03-30: qty 2

## 2013-03-30 MED ORDER — PRENATAL MULTIVITAMIN CH
1.0000 | ORAL_TABLET | Freq: Every day | ORAL | Status: DC
  Administered 2013-03-31: 1 via ORAL
  Filled 2013-03-30: qty 1

## 2013-03-30 MED ORDER — PENICILLIN G POTASSIUM 5000000 UNITS IJ SOLR
5.0000 10*6.[IU] | Freq: Once | INTRAVENOUS | Status: AC
  Administered 2013-03-30: 5 10*6.[IU] via INTRAVENOUS
  Filled 2013-03-30: qty 5

## 2013-03-30 MED ORDER — OXYTOCIN BOLUS FROM INFUSION: 500.0000 mL | INTRAVENOUS | Status: DC

## 2013-03-30 MED ORDER — LIDOCAINE HCL (PF) 1 % IJ SOLN
INTRAMUSCULAR | Status: DC | PRN
  Administered 2013-03-30 (×2): 4 mL

## 2013-03-30 MED ORDER — FENTANYL 2.5 MCG/ML BUPIVACAINE 1/10 % EPIDURAL INFUSION (WH - ANES)
INTRAMUSCULAR | Status: DC | PRN
  Administered 2013-03-30: 14 mL/h via EPIDURAL

## 2013-03-30 MED ORDER — ONDANSETRON HCL 4 MG/2ML IJ SOLN: 4.0000 mg | INTRAMUSCULAR | Status: DC | PRN

## 2013-03-30 MED ORDER — LACTATED RINGERS IV SOLN
500.0000 mL | Freq: Once | INTRAVENOUS | Status: AC
  Administered 2013-03-30: 500 mL via INTRAVENOUS

## 2013-03-30 MED ORDER — OXYCODONE-ACETAMINOPHEN 5-325 MG PO TABS: 1.0000 | ORAL_TABLET | ORAL | Status: DC | PRN

## 2013-03-30 MED ORDER — LIDOCAINE HCL (PF) 1 % IJ SOLN
30.0000 mL | INTRAMUSCULAR | Status: DC | PRN
  Filled 2013-03-30 (×2): qty 30

## 2013-03-30 MED ORDER — IBUPROFEN 600 MG PO TABS: 600.0000 mg | ORAL_TABLET | Freq: Four times a day (QID) | ORAL | Status: DC | PRN

## 2013-03-30 MED ORDER — LANOLIN HYDROUS EX OINT: TOPICAL_OINTMENT | CUTANEOUS | Status: DC | PRN

## 2013-03-30 MED ORDER — DIPHENHYDRAMINE HCL 25 MG PO CAPS: 25.0000 mg | ORAL_CAPSULE | Freq: Four times a day (QID) | ORAL | Status: DC | PRN

## 2013-03-30 MED ORDER — ZOLPIDEM TARTRATE 5 MG PO TABS: 5.0000 mg | ORAL_TABLET | Freq: Every evening | ORAL | Status: DC | PRN

## 2013-03-30 MED ORDER — FENTANYL 2.5 MCG/ML BUPIVACAINE 1/10 % EPIDURAL INFUSION (WH - ANES)
14.0000 mL/h | INTRAMUSCULAR | Status: DC | PRN
  Filled 2013-03-30: qty 125

## 2013-03-30 MED ORDER — CITRIC ACID-SODIUM CITRATE 334-500 MG/5ML PO SOLN: 30.0000 mL | ORAL | Status: DC | PRN

## 2013-03-30 MED ORDER — BENZOCAINE-MENTHOL 20-0.5 % EX AERO: 1.0000 "application " | INHALATION_SPRAY | CUTANEOUS | Status: DC | PRN

## 2013-03-30 MED ORDER — WITCH HAZEL-GLYCERIN EX PADS: 1.0000 "application " | MEDICATED_PAD | CUTANEOUS | Status: DC | PRN

## 2013-03-30 MED ORDER — LACTATED RINGERS IV SOLN: 500.0000 mL | INTRAVENOUS | Status: DC | PRN

## 2013-03-30 MED ORDER — OXYTOCIN 40 UNITS IN LACTATED RINGERS INFUSION - SIMPLE MED: 62.5000 mL/h | INTRAVENOUS | Status: DC

## 2013-03-30 MED ORDER — TERBUTALINE SULFATE 1 MG/ML IJ SOLN: 0.2500 mg | Freq: Once | INTRAMUSCULAR | Status: DC | PRN

## 2013-03-30 MED ORDER — PENICILLIN G POTASSIUM 5000000 UNITS IJ SOLR
2.5000 10*6.[IU] | INTRAVENOUS | Status: DC
  Administered 2013-03-30: 2.5 10*6.[IU] via INTRAVENOUS
  Filled 2013-03-30 (×3): qty 2.5

## 2013-03-30 MED ORDER — SIMETHICONE 80 MG PO CHEW: 80.0000 mg | CHEWABLE_TABLET | ORAL | Status: DC | PRN

## 2013-03-30 MED ORDER — PHENYLEPHRINE 40 MCG/ML (10ML) SYRINGE FOR IV PUSH (FOR BLOOD PRESSURE SUPPORT)
80.0000 ug | PREFILLED_SYRINGE | INTRAVENOUS | Status: DC | PRN
  Filled 2013-03-30: qty 10
  Filled 2013-03-30: qty 2

## 2013-03-30 MED ORDER — ONDANSETRON HCL 4 MG PO TABS: 4.0000 mg | ORAL_TABLET | ORAL | Status: DC | PRN

## 2013-03-30 MED ORDER — NALBUPHINE SYRINGE 5 MG/0.5 ML
10.0000 mg | INJECTION | Freq: Once | INTRAMUSCULAR | Status: AC
  Administered 2013-03-30: 10 mg via INTRAMUSCULAR
  Filled 2013-03-30: qty 1

## 2013-03-30 MED ORDER — ONDANSETRON HCL 4 MG/2ML IJ SOLN: 4.0000 mg | Freq: Four times a day (QID) | INTRAMUSCULAR | Status: DC | PRN

## 2013-03-30 MED ORDER — OXYTOCIN 40 UNITS IN LACTATED RINGERS INFUSION - SIMPLE MED
1.0000 m[IU]/min | INTRAVENOUS | Status: DC
  Administered 2013-03-30: 2 m[IU]/min via INTRAVENOUS
  Filled 2013-03-30: qty 1000

## 2013-03-30 MED ORDER — FENTANYL CITRATE 0.05 MG/ML IJ SOLN: 100.0000 ug | INTRAMUSCULAR | Status: DC | PRN

## 2013-03-30 MED ORDER — ACETAMINOPHEN 325 MG PO TABS: 650.0000 mg | ORAL_TABLET | ORAL | Status: DC | PRN

## 2013-03-30 NOTE — OB Triage Note (Signed)
Patient states contractions started around 8am with contractions progressively getting stronger. 3-4 minutes apart

## 2013-03-30 NOTE — Anesthesia Procedure Notes (Signed)
Epidural Patient location during procedure: OB Start time: 03/30/2013 4:54 PM  Staffing Anesthesiologist: Taronda Comacho A. Performed by: anesthesiologist   Preanesthetic Checklist Completed: patient identified, site marked, surgical consent, pre-op evaluation, timeout performed, IV checked, risks and benefits discussed and monitors and equipment checked  Epidural Patient position: sitting Prep: site prepped and draped and DuraPrep Patient monitoring: continuous pulse ox and blood pressure Approach: midline Injection technique: LOR air  Needle:  Needle type: Tuohy  Needle gauge: 17 G Needle length: 9 cm and 9 Needle insertion depth: 5 cm and 10 cm Catheter type: closed end flexible Catheter size: 19 Gauge Catheter at skin depth: 15 cm Test dose: negative and Other  Assessment Events: blood not aspirated, injection not painful, no injection resistance, negative IV test and no paresthesia  Additional Notes Patient identified. Risks and benefits discussed including failed block, incomplete  Pain control, post dural puncture headache, nerve damage, paralysis, blood pressure Changes, nausea, vomiting, reactions to medications-both toxic and allergic and post Partum back pain. All questions were answered. Patient expressed understanding and wished to proceed. Sterile technique was used throughout procedure. Epidural site was Dressed with sterile barrier dressing. No paresthesias, signs of intravascular injection Or signs of intrathecal spread were encountered. Attempt x 2 Patient was more comfortable after the epidural was dosed. Please see RN's note for documentation of vital signs and FHR which are stable.

## 2013-03-30 NOTE — Anesthesia Preprocedure Evaluation (Signed)
Anesthesia Evaluation  Patient identified by MRN, date of birth, ID band Patient awake    Reviewed: Allergy & Precautions, H&P , Patient's Chart, lab work & pertinent test results  Airway Mallampati: III TM Distance: >3 FB Neck ROM: full    Dental no notable dental hx. (+) Teeth Intact   Pulmonary neg pulmonary ROS,  breath sounds clear to auscultation  Pulmonary exam normal       Cardiovascular negative cardio ROS  Rhythm:regular Rate:Normal     Neuro/Psych negative neurological ROS  negative psych ROS   GI/Hepatic negative GI ROS, Neg liver ROS,   Endo/Other  diabetes, Well Controlled, GestationalMorbid obesity  Renal/GU negative Renal ROS  negative genitourinary   Musculoskeletal   Abdominal Normal abdominal exam  (+)   Peds  Hematology negative hematology ROS (+)   Anesthesia Other Findings   Reproductive/Obstetrics (+) Pregnancy                           Anesthesia Physical Anesthesia Plan  ASA: III  Anesthesia Plan: Epidural   Post-op Pain Management:    Induction:   Airway Management Planned:   Additional Equipment:   Intra-op Plan:   Post-operative Plan:   Informed Consent: I have reviewed the patients History and Physical, chart, labs and discussed the procedure including the risks, benefits and alternatives for the proposed anesthesia with the patient or authorized representative who has indicated his/her understanding and acceptance.     Plan Discussed with: Anesthesiologist  Anesthesia Plan Comments:         Anesthesia Quick Evaluation

## 2013-03-30 NOTE — Progress Notes (Signed)
LABOR PROGRESS NOTE  Illona Ross is a 22 y.o. G2P0010 at [redacted]w[redacted]d admitted for term labor  Subjective: Uncomfortable with contractions. Feeling some increased vaginal pressure.   Objective: BP 120/80  Pulse 96  Temp(Src) 98.5 F (36.9 C) (Oral)  Resp 20  Ht 5\' 7"  (1.702 m)  Wt 117.028 kg (258 lb)  BMI 40.40 kg/m2    FHT:  FHR: 120 bpm, variability: mostly moderate with some periods of minimal,  accelerations:  Present,  decelerations:  Present: 1 variable, possile late decel ~15:45 however strip broken (nadir 90, duration 120 secs) UC:   irregular, every 1-4 minutes SVE:   Dilation: 4 Effacement (%): 80 Station: -2 Exam by:: Dr [place] Sink  Labs: Lab Results  Component Value Date   WBC 12.1* 03/30/2013   HGB 13.0 03/30/2013   HCT 37.9 03/30/2013   MCV 77.2* 03/30/2013   PLT 240 03/30/2013    Assessment / Plan: Spontaneous labor  Labor: Contraction pattern irregular, no cervical change since last exam, will start Pitocin.  GBS: Positive. Continue Penicillin per protocol.  Fetal Wellbeing:  Category II Pain Control:  Pt requesting Epidural. Anesthesia notified.  Anticipated MOD:  NSVD  Hal Neer, MD 03/30/2013, 4:13 PM

## 2013-03-30 NOTE — H&P (Signed)
Alexandria Ross is a 22 y.o. female G2P0010 with IUP at [redacted]w[redacted]d by 8wk ultrasound presenting for contractions. Pt reports onset of painful contractions around 0800 this morning. Since that time they have been increasing in intensity and frequency. Currently occurring ever 2-4 mins. She denies LOF and vaginal bleeding. She reports normal fetal movement.   Her pregnancy has been complicated by diet-controlled GDM. A growth ultrasound on 10/29 at [redacted]w[redacted]d with EFW 2646 grams (24%ile). She states she has been checking her blood sugar ~3 times daily. Per her reports values are reasonably well controlled with an occasional value above goal. She also has a history of ASCUS/high risk HPV+ pap smear on 07/18/2012 and will need repeat pap smear in 1 year. Her UDS on 7/21 was positive for [facility] however subsequent UDS on 9/18 and 10/27 were negative.   [facility] at [facility] department since 23 wks, she then transferred her care to [facility] at 37wks due to GDM. She is GBS positive. She plans to attempt breastfeeding. She desires Nexplanon for postpartum contraception.   Past Medical History: Past Medical History  Diagnosis Date  . Gestational diabetes   . Chlamydia 1 year ago  . Abnormal Pap smear     ASC-US, HPV +    Past Surgical History: History reviewed. No pertinent past surgical history.  Obstetrical History: OB History   Grav Para Term Preterm Abortions TAB SAB Ect Mult Living   2 0 0 0 1 1 0 0 0 0       Gynecological History: OB History   Grav Para Term Preterm Abortions TAB SAB Ect Mult Living   1               Social History: History   Social History  . Marital Status: Single    Spouse Name: N/A    Number of Children: N/A  . Years of Education: N/A   Social History Main Topics  . Smoking status: Never Smoker   . Smokeless tobacco: Never Used  . Alcohol Use: Yes     Comment: occ  . Drug Use: No  . Sexual Activity: Yes   Other Topics Concern  . None   Social History Narrative   . None    Family History: Family History  Problem Relation Age of Onset  . Diabetes Mother     Allergies: No Known Allergies  Prescriptions prior to admission  Medication Sig Dispense Refill  . Prenatal Vit-Fe Fumarate-FA (PRENATAL MULTIVITAMIN) TABS tablet Take 1 tablet by mouth daily at 12 noon.      Marland Kitchen ACCU-CHEK FASTCLIX LANCETS MISC 1 Units by Does not apply route 4 (four) times daily.  102 each  3  . Blood Glucose Monitoring Suppl (ACCU-CHEK NANO SMARTVIEW) W/DEVICE KIT 1 Device by Does not apply route 4 (four) times daily.  1 kit  0  . glucose blood (ACCU-CHEK SMARTVIEW) test strip Use 4 times daily  51 each  12     Review of Systems  As per HPI  Blood pressure 129/90, pulse 78, temperature 97.9 F (36.6 C), temperature source Oral, resp. rate 20, height 5\' 7"  (1.702 m), weight 117.028 kg (258 lb). General appearance: alert, cooperative and no distress. Appears uncomfortable with contractions.  Lungs: clear to auscultation bilaterally Heart: regular rate and rhythm Abdomen: soft, non-tender; bowel sounds normal. EFW 6.5 lbs by [place].  Extremities: Homans sign is negative, no sign of DVT Presentation: cephalic Fetal monitoring: Baseline: 120 bpm, Variability: moderate, Accelerations: present, Decelerations: occasional. Early  vs late at 12:08, strip broken at that time and toco not tracing well so difficult to characterize. Variable at 13:45 and variable at 14:20 although the strip is again broken.  Uterine activity: q2-3 mins Dilation: 4 Effacement (%): 70 Station: -2;-3 Exam by:: Dr [place] Sink   Prenatal labs: ABO, Rh: O/Positive/-- (07/07 0000) Antibody: Negative (07/07 0000) Rubella:  Immune (7/21) RPR: Nonreactive (07/21 0000)  HBsAg: Negative (07/21 0000)  HIV: Non-reactive (07/21 0000)  GBS: Positive (10/27 0000)  1 hr Glucola: 146. 3 hr Glucolse: 106, 202, 183 Genetic screening: too late to [facility] Anatomy US normal Growth ultrasound 10/29 at [redacted]w[redacted]d: FW 2646  (24%ile), AFI 12.8, Anterior placenta.   Results for orders placed during the hospital encounter of 03/30/13 (from the past 12 hour(s))  CBC   Collection Time    03/30/13  2:35 PM      Result Value Range   WBC 12.1 (*) 4.0 - 10.5 K/uL   RBC 4.91  3.87 - 5.11 MIL/uL   Hemoglobin 13.0  12.0 - 15.0 g/dL   HCT 16.1  09.6 - 04.5 %   MCV 77.2 (*) 78.0 - 100.0 fL   MCH 26.5  26.0 - 34.0 pg   MCHC 34.3  30.0 - 36.0 g/dL   RDW 40.9  81.1 - 91.4 %   Platelets 240  150 - 400 K/uL  GLUCOSE, CAPILLARY   Collection Time    03/30/13  3:10 PM      Result Value Range   Glucose-Capillary 129 (*) 70 - 99 mg/dL    Assessment: Alexandria Ross is a 22 y.o. G2P0010 with an IUP at [redacted]w[redacted]d presenting for term early labor. Cervix progressed from 2cm to 4cm dilated in the last 2 hours.   Plan: 1. Admit to Labor and Delivery. Routine orders placed.  2. Labor: Progressing spontaneously at this time. Monitor for continued cervical progression.  3. FWB: Cat II 4. Gestational diabetes, A1: Glucose 129 on admission. Planned further monitoring not necessary.  5. GBS positive. Will start Penicillin per protocol.  6. She is undecided on epidural. Can use Fentanyl IV q1PRN until >/=8cm.  7. She plans to breast feed.  8. She desires Nexplanon for postpartum contraception.  9. She is having a girl. 10. ASCUS/high risk HPV+ pap smear in 07/2012: Per ASCCP guidelines will need repeat pap smear in 1 year (07/2013).     Alexandria Neer, MD 03/30/2013, 2:26 PM  Seen by me also Agree with note Aviva Signs, CNM

## 2013-03-30 NOTE — Progress Notes (Signed)
Patient ID: Alexandria Ross, female   DOB: 09-08-1990, 22 y.o.   MRN: [id number]  Now pushing. Has been laboring down since complete.  FHR stable and reactive, Cat I  UCs every 2 min  Dilation: 10 Dilation Complete Date: 03/30/13 Dilation Complete Time: 1806 Effacement (%): 100 Cervical Position: Middle Station: -1;0 Presentation: Vertex Exam by:: L. Cresenzo, rN  Will anticipate SVD soon

## 2013-03-30 NOTE — Progress Notes (Signed)
Moms band was scanned for CBG instead of baby band. Baby CBG 65 not mom.

## 2013-03-31 LAB — GLUCOSE, CAPILLARY: Glucose-Capillary: 65 mg/dL — ABNORMAL LOW (ref 70–99)

## 2013-03-31 NOTE — Progress Notes (Signed)
Post Partum Day #1 Subjective: no complaints, up ad lib and tolerating PO; breastfeeding going well; plans on Nexplanon for contraception  Objective: Blood pressure 111/64, pulse 73, temperature 98.1 F (36.7 C), temperature source Oral, resp. rate 24, height 5\' 7"  (1.702 m), weight 117.028 kg (258 lb), SpO2 100.00%, unknown if currently breastfeeding.  Physical Exam:  General: alert, cooperative and no distress Lochia: appropriate Uterine Fundus: firm DVT Evaluation: No evidence of DVT seen on physical exam.   Recent Labs  03/30/13 1435  HGB 13.0  HCT 37.9    Assessment/Plan: Plan for discharge tomorrow- will not be 24hrs post del until 8p, so plan on AM discharge 11/16   LOS: 1 day   SHAW, KIMBERLY 03/31/2013, 7:19 AM

## 2013-03-31 NOTE — H&P (Signed)
Attestation of Attending Supervision of Advanced Practitioner (CNM/NP): Evaluation and management procedures were performed by the Advanced Practitioner under my supervision and collaboration.  I have reviewed the Advanced Practitioner's note and chart, and I agree with the management and plan.  Fraidy Mccarrick 03/31/2013 8:29 AM   

## 2013-03-31 NOTE — Anesthesia Postprocedure Evaluation (Signed)
Anesthesia Post Note  Patient: Alexandria Ross  Procedure(s) Performed: * No procedures listed *  Anesthesia type: Epidural  Patient location: Mother/Baby  Post pain: Pain level controlled  Post assessment: Post-op Vital signs reviewed  Last Vitals:  Filed Vitals:   03/31/13 0256  BP: 111/64  Pulse: 73  Temp: 36.7 C  Resp: 24    Post vital signs: Reviewed  Level of consciousness:alert  Complications: No apparent anesthesia complications

## 2013-04-01 ENCOUNTER — Inpatient Hospital Stay (HOSPITAL_COMMUNITY): Admission: RE | Admit: 2013-04-01 | Payer: Medicaid Other | Source: Ambulatory Visit

## 2013-04-01 LAB — CULTURE, OB URINE: Organism ID, Bacteria: >=100000

## 2013-04-01 MED ORDER — IBUPROFEN 600 MG PO TABS: 600.0000 mg | ORAL_TABLET | Freq: Four times a day (QID) | ORAL | Status: DC | PRN

## 2013-04-01 NOTE — Discharge Summary (Signed)
Obstetric Discharge Summary Reason for Admission: onset of labor, also w/ A1DM Prenatal Procedures: ultrasound Intrapartum Procedures: spontaneous vaginal delivery and GBS prophylaxis Postpartum Procedures: none Complications-Operative and Postpartum: periurethral lac Hemoglobin  Date Value Range Status  03/30/2013 13.0  12.0 - 15.0 g/dL Final     HCT  Date Value Range Status  03/30/2013 37.9  36.0 - 46.0 % Final    Physical Exam:  General: alert, cooperative and no distress Lochia: appropriate Uterine Fundus: firm Incision: n/a DVT Evaluation: No evidence of DVT seen on physical exam. Negative Homan's sign. No cords or calf tenderness. No significant calf/ankle edema.  Discharge Diagnoses: Term Pregnancy-delivered  Discharge Information: Date: 04/01/2013 Activity: pelvic rest Diet: routine Medications: PNV and Ibuprofen Condition: stable Instructions: refer to practice specific booklet Discharge to: home Follow-up Information   Schedule an appointment as soon as possible for a visit with [facility]. (4wks for nexplanon insertion, 6wks for postpartum visit and sugar test)    Specialty:  Obstetrics and Gynecology   Contact information:   [address] [place] [address] [phone]      Newborn Data: Live born female  Birth Weight: 6 lb 11.8 oz (3056 g) APGAR: 9, 9  Home with mother. Breast/bottlefeeding, plans for nexplanon Will need 6wk pp 2hr gtt  Marge Duncans 04/01/2013, 7:37 AM

## 2013-04-01 NOTE — Progress Notes (Signed)
Clinical Social Work Department PSYCHOSOCIAL ASSESSMENT - MATERNAL/CHILD 04/01/2013  Patient:  Alexandria Ross, Alexandria Ross  Account Number:  [id number]  Admit Date:  03/30/2013  Marjo Bicker Name:   Alexandria Ross    Clinical Social Worker:  Vernetta Dizdarevic, LCSW   Date/Time:  04/01/2013 09:00 AM  Date Referred:  04/01/2013   Referral source  CSW     Referred reason  Substance Abuse   Other referral source:    I:  FAMILY / HOME ENVIRONMENT Child's legal guardian:  PARENT  Guardian - Name Guardian - Age Guardian - Address  Mainor,Sahmya 24 [address]  [place], [place] [address]  McCombs, Jomarie Longs 22    Other household support members/support persons Other support:    II  PSYCHOSOCIAL DATA Information Source:  Patient Interview  Event organiser Employment:   Both parents employed   Surveyor, quantity resources:  [employer] If [employer]:   Other  WIC   School / Grade:   Maternity Care Coordinator / Child Services Coordination / Early Interventions:  Cultural issues impacting care:    III  STRENGTHS Strengths  Adequate Resources  Home prepared for Child (including basic supplies)  Supportive family/friends   Strength comment:    IV  RISK FACTORS AND CURRENT PROBLEMS Current Problem:       V  SOCIAL WORK ASSESSMENT Met with mother who was pleasant and receptive to social work intervention.  FOB was present and mother gave permission for this writer to speak with her in his presence.  Parents are not married,  and FOB have one other dependent.  Both parents are employed.   Mother admits to occasional use of marijuana early in the pregnancy before she was aware of it.   She communicate no intent to continue using marijuana.   UDS on newborn was negative. She denies any hx of mental illness.   Mother reports extensive family support.   No acute social concerns reported at this time.      VI SOCIAL WORK PLAN  Type of pt/family education:   If child protective services  report - county:   If child protective services report - date:   Information/referral to community resources comment:   Pediatrician: [employer]   Other social work plan:   CSW will follow PRN.    Gleason Ardoin J, LCSW

## 2013-04-03 NOTE — Discharge Summary (Signed)
Attestation of Attending Supervision of Advanced Practitioner (CNM/NP): Evaluation and management procedures were performed by the Advanced Practitioner under my supervision and collaboration.  I have reviewed the Advanced Practitioner's note and chart, and I agree with the management and plan.  Peregrine Nolt 04/03/2013 9:33 AM   

## 2013-04-04 NOTE — Progress Notes (Signed)
Post discharge chart review completed.  

## 2013-04-06 ENCOUNTER — Telehealth: Payer: Self-pay

## 2013-04-06 NOTE — Telephone Encounter (Signed)
Pt called and stated "this message is for Dr. Jolayne Panther, I delivered over the weekend can I get return call back." Called pt and pt stated that she needed paperwork filled out for her job.  I advised pt to bring her FMLA paperwork to the front desk and fill out a ROI and please give [place] 7-10 business days to complete paperwork from which you brought in.  Pt state understanding and that she would bring in her paperwork.

## 2013-05-03 ENCOUNTER — Encounter: Payer: Self-pay | Admitting: Obstetrics & Gynecology

## 2013-05-03 ENCOUNTER — Ambulatory Visit (INDEPENDENT_AMBULATORY_CARE_PROVIDER_SITE_OTHER): Payer: Medicaid Other | Admitting: Obstetrics & Gynecology

## 2013-05-03 DIAGNOSIS — Z3049 Encounter for surveillance of other contraceptives: Secondary | ICD-10-CM

## 2013-05-03 LAB — POCT PREGNANCY, URINE: Preg Test, Ur: NEGATIVE

## 2013-05-03 MED ORDER — MEDROXYPROGESTERONE ACETATE 104 MG/0.65ML ~~LOC~~ SUSP
104.0000 mg | Freq: Once | SUBCUTANEOUS | Status: AC
  Administered 2013-05-03: 104 mg via SUBCUTANEOUS

## 2013-05-03 NOTE — Progress Notes (Signed)
Patient ID: Alexandria Ross, female   DOB: July 23, 1990, 22 y.o.   MRN: [id number] Subjective:     Alexandria Ross is a 22 y.o. female who presents for a postpartum visit. She is 5 weeks postpartum following a spontaneous vaginal delivery. I have fully reviewed the prenatal and intrapartum course. The delivery was at term. Outcome: spontaneous vaginal delivery. Anesthesia: epidural. Postpartum course has been uncomplicated. Baby's course has been unremarkable. Baby is feeding by bottle. Bleeding on her menses currently. Bowel function is normal. Bladder function is normal. Patient is sexually active x 1 since delivery. Contraception method is none. Postpartum depression screening: negative.  The following portions of the patient's history were reviewed and updated as appropriate: allergies, current medications, past family history, past medical history, past social history, past surgical history and problem list.  Review of Systems A comprehensive review of systems was negative.   Objective:    BP 134/90  Pulse 67  Temp(Src) 97.9 F (36.6 C) (Oral)  Ht 5\' 7"  (1.702 m)  Wt 229 lb 12.8 oz (104.237 kg)  BMI 35.98 kg/m2  Breastfeeding? No  General:  alert and no distress           Abdomen: soft, non-tender; bowel sounds normal; no masses,  no organomegaly   Vulva:  normal  Vagina: normal vagina, no discharge, exudate, lesion, or erythema  Cervix:  no cervical motion tenderness  Corpus: normal size, contour, position, consistency, mobility, non-tender  Adnexa:  no mass, fullness, tenderness  Rectal Exam: Not performed.        Assessment:     5 week postpartum exam. Pap smear not done at today's visit.  H/o GDM- needs 2 hours GTT  Plan:    1. Contraception: Depo-Provera injections 2. 1st injection given today (pt on menses) 3. Follow up in: 2 weeks for 2 hours GTT

## 2013-05-03 NOTE — Patient Instructions (Signed)

## 2013-05-03 NOTE — Addendum Note (Signed)
Addended by: Faythe Casa on: 05/03/2013 04:49 PM   Modules accepted: Orders

## 2013-07-25 ENCOUNTER — Ambulatory Visit (INDEPENDENT_AMBULATORY_CARE_PROVIDER_SITE_OTHER): Payer: Medicaid Other | Admitting: *Deleted

## 2013-07-25 VITALS — BP 124/88 | HR 77 | Wt 228.0 lb

## 2013-07-25 DIAGNOSIS — Z3049 Encounter for surveillance of other contraceptives: Secondary | ICD-10-CM

## 2013-07-25 MED ORDER — MEDROXYPROGESTERONE ACETATE 104 MG/0.65ML ~~LOC~~ SUSP
104.0000 mg | Freq: Once | SUBCUTANEOUS | Status: AC
  Administered 2013-07-25: 104 mg via SUBCUTANEOUS

## 2013-10-24 ENCOUNTER — Ambulatory Visit: Payer: Medicaid Other

## 2013-10-25 ENCOUNTER — Ambulatory Visit (INDEPENDENT_AMBULATORY_CARE_PROVIDER_SITE_OTHER): Payer: Medicaid Other | Admitting: General Practice

## 2013-10-25 VITALS — BP 120/81 | HR 69 | Temp 96.7°F | Ht 67.0 in | Wt 227.8 lb

## 2013-10-25 DIAGNOSIS — IMO0001 Reserved for inherently not codable concepts without codable children: Secondary | ICD-10-CM

## 2013-10-25 DIAGNOSIS — Z3049 Encounter for surveillance of other contraceptives: Secondary | ICD-10-CM

## 2013-10-25 MED ORDER — MEDROXYPROGESTERONE ACETATE 104 MG/0.65ML ~~LOC~~ SUSP
104.0000 mg | Freq: Once | SUBCUTANEOUS | Status: AC
  Administered 2013-10-25: 104 mg via SUBCUTANEOUS

## 2013-10-26 NOTE — Progress Notes (Signed)
Agree with nurses's documentation of this patient's clinic encounter for contraception injection.  Tereso NewcomerUgonna A Emili Mcloughlin, MD

## 2014-01-16 ENCOUNTER — Ambulatory Visit (INDEPENDENT_AMBULATORY_CARE_PROVIDER_SITE_OTHER): Payer: Medicaid Other

## 2014-01-16 VITALS — BP 111/66 | HR 78 | Temp 98.7°F | Wt 233.7 lb

## 2014-01-16 DIAGNOSIS — Z3049 Encounter for surveillance of other contraceptives: Secondary | ICD-10-CM

## 2014-01-16 MED ORDER — MEDROXYPROGESTERONE ACETATE 104 MG/0.65ML ~~LOC~~ SUSP
104.0000 mg | Freq: Once | SUBCUTANEOUS | Status: AC
  Administered 2014-09-30: 104 mg via SUBCUTANEOUS

## 2014-01-16 NOTE — Progress Notes (Signed)
Patient here today for depo provera injection-- within appropriate time frame. Depo-provera  administered into RLQ abdomen. Patient tolerated well. No questions, concerns, or problems to report. To return between 11/24-12/9 for next injection.

## 2014-03-18 ENCOUNTER — Encounter: Payer: Self-pay | Admitting: Obstetrics & Gynecology

## 2014-04-17 ENCOUNTER — Ambulatory Visit (INDEPENDENT_AMBULATORY_CARE_PROVIDER_SITE_OTHER): Payer: Medicaid Other | Admitting: *Deleted

## 2014-04-17 VITALS — BP 111/71 | HR 97 | Temp 98.6°F | Wt 243.4 lb

## 2014-04-17 DIAGNOSIS — Z3042 Encounter for surveillance of injectable contraceptive: Secondary | ICD-10-CM | POA: Diagnosis not present

## 2014-04-17 MED ORDER — MEDROXYPROGESTERONE ACETATE 104 MG/0.65ML ~~LOC~~ SUSP
104.0000 mg | Freq: Once | SUBCUTANEOUS | Status: AC
  Administered 2014-04-17: 104 mg via SUBCUTANEOUS

## 2014-07-08 ENCOUNTER — Ambulatory Visit (INDEPENDENT_AMBULATORY_CARE_PROVIDER_SITE_OTHER): Payer: Medicaid Other | Admitting: *Deleted

## 2014-07-08 VITALS — BP 118/78 | HR 69

## 2014-07-08 DIAGNOSIS — Z3042 Encounter for surveillance of injectable contraceptive: Secondary | ICD-10-CM

## 2014-07-08 MED ORDER — MEDROXYPROGESTERONE ACETATE 104 MG/0.65ML ~~LOC~~ SUSP
104.0000 mg | Freq: Once | SUBCUTANEOUS | Status: AC
  Administered 2014-07-08: 104 mg via SUBCUTANEOUS

## 2014-09-30 ENCOUNTER — Ambulatory Visit (INDEPENDENT_AMBULATORY_CARE_PROVIDER_SITE_OTHER): Payer: Medicaid Other

## 2014-09-30 VITALS — BP 117/75 | HR 71 | Temp 98.5°F | Wt 247.2 lb

## 2014-09-30 DIAGNOSIS — Z3042 Encounter for surveillance of injectable contraceptive: Secondary | ICD-10-CM

## 2014-09-30 MED ORDER — MEDROXYPROGESTERONE ACETATE 104 MG/0.65ML ~~LOC~~ SUSP: 104.0000 mg | Freq: Once | SUBCUTANEOUS | Status: DC

## 2014-12-23 ENCOUNTER — Ambulatory Visit: Payer: Medicaid Other

## 2015-02-03 ENCOUNTER — Ambulatory Visit (INDEPENDENT_AMBULATORY_CARE_PROVIDER_SITE_OTHER): Payer: Medicaid Other | Admitting: General Practice

## 2015-02-03 VITALS — BP 129/74 | HR 76 | Temp 97.7°F | Ht 67.0 in | Wt 244.0 lb

## 2015-02-03 DIAGNOSIS — Z3042 Encounter for surveillance of injectable contraceptive: Secondary | ICD-10-CM | POA: Diagnosis not present

## 2015-02-03 LAB — POCT PREGNANCY, URINE: PREG TEST UR: NEGATIVE

## 2015-02-03 MED ORDER — MEDROXYPROGESTERONE ACETATE 150 MG/ML IM SUSP
150.0000 mg | Freq: Once | INTRAMUSCULAR | Status: AC
  Administered 2015-02-03: 150 mg via INTRAMUSCULAR

## 2015-02-03 NOTE — Progress Notes (Signed)
Patient here today for depo. 18 weeks has elapsed since last dose. Patient denies recent unprotected intercourse. Obtained negative upt. Will give depo dose.

## 2015-05-01 ENCOUNTER — Ambulatory Visit: Payer: Medicaid Other

## 2015-05-05 ENCOUNTER — Ambulatory Visit: Payer: Medicaid Other

## 2015-06-05 ENCOUNTER — Encounter: Payer: Self-pay | Admitting: Obstetrics & Gynecology

## 2015-06-05 ENCOUNTER — Ambulatory Visit (INDEPENDENT_AMBULATORY_CARE_PROVIDER_SITE_OTHER): Payer: Medicaid Other | Admitting: Obstetrics & Gynecology

## 2015-06-05 VITALS — BP 136/81 | HR 79 | Temp 98.9°F | Wt 250.0 lb

## 2015-06-05 DIAGNOSIS — Z3042 Encounter for surveillance of injectable contraceptive: Secondary | ICD-10-CM | POA: Diagnosis not present

## 2015-06-05 DIAGNOSIS — Z3009 Encounter for other general counseling and advice on contraception: Secondary | ICD-10-CM | POA: Diagnosis not present

## 2015-06-05 LAB — POCT PREGNANCY, URINE: Preg Test, Ur: NEGATIVE

## 2015-06-05 MED ORDER — MEDROXYPROGESTERONE ACETATE 150 MG/ML IM SUSP: 150.0000 mg | INTRAMUSCULAR | Status: DC

## 2015-06-05 MED ORDER — MEDROXYPROGESTERONE ACETATE 104 MG/0.65ML ~~LOC~~ SUSY
104.0000 mg | PREFILLED_SYRINGE | Freq: Once | SUBCUTANEOUS | Status: DC
  Administered 2015-06-05: 104 mg via SUBCUTANEOUS

## 2015-06-05 NOTE — Progress Notes (Signed)
Patient ID: Alexandria Ross, female   DOB: 1990-07-06, 25 y.o.   MRN: [id number] History:  25 y.o. G2P1011 here today to restart Depo provera.  Last dose was Sept.  Pt had menses late Nov to Dec 93 1/2 weeks of bleeding).  Pt last sexually actve 3-4 weeks prev.  PRIOR to the irrg bleeding epsioce.   The following portions of the patient's history were reviewed and updated as appropriate: allergies, current medications, past family history, past medical history, past social history, past surgical history and problem list.  Review of Systems:  Pertinent items are noted in HPI.  Objective:  Physical Exam Blood pressure 136/81, pulse 79, temperature 98.9 F (37.2 C), weight 250 lb (113.399 kg), last menstrual period 04/14/2015. Gen: NAD Exam deferred  Assessment & Plan:  Restart Depo Provera  Depo Provera  IM x 1 F/u in 3 months

## 2015-06-05 NOTE — Patient Instructions (Signed)

## 2015-06-06 LAB — POCT PREGNANCY, URINE: Preg Test, Ur: NEGATIVE

## 2015-08-21 ENCOUNTER — Ambulatory Visit: Payer: Medicaid Other

## 2015-08-28 ENCOUNTER — Ambulatory Visit (INDEPENDENT_AMBULATORY_CARE_PROVIDER_SITE_OTHER): Payer: Medicaid Other | Admitting: General Practice

## 2015-08-28 VITALS — BP 120/74 | HR 81 | Ht 67.0 in | Wt 250.0 lb

## 2015-08-28 DIAGNOSIS — Z3042 Encounter for surveillance of injectable contraceptive: Secondary | ICD-10-CM

## 2015-08-28 MED ORDER — MEDROXYPROGESTERONE ACETATE 150 MG/ML IM SUSP
150.0000 mg | Freq: Once | INTRAMUSCULAR | Status: AC
  Administered 2015-08-28: 150 mg via INTRAMUSCULAR

## 2015-11-24 ENCOUNTER — Ambulatory Visit (INDEPENDENT_AMBULATORY_CARE_PROVIDER_SITE_OTHER): Payer: Medicaid Other

## 2015-11-24 VITALS — BP 107/67 | HR 76

## 2015-11-24 DIAGNOSIS — Z3042 Encounter for surveillance of injectable contraceptive: Secondary | ICD-10-CM

## 2015-11-24 MED ORDER — MEDROXYPROGESTERONE ACETATE 150 MG/ML IM SUSP
150.0000 mg | Freq: Once | INTRAMUSCULAR | Status: AC
  Administered 2015-11-24: 150 mg via INTRAMUSCULAR

## 2015-11-24 NOTE — Progress Notes (Signed)
Pt presented to clinic today for her depo-provera. Pt tolerated well and will follow up between 09/25-10-09 for her next injection.

## 2016-02-10 ENCOUNTER — Ambulatory Visit (INDEPENDENT_AMBULATORY_CARE_PROVIDER_SITE_OTHER): Payer: Medicaid Other

## 2016-02-10 DIAGNOSIS — Z3042 Encounter for surveillance of injectable contraceptive: Secondary | ICD-10-CM

## 2016-02-10 MED ORDER — MEDROXYPROGESTERONE ACETATE 150 MG/ML IM SUSP
150.0000 mg | Freq: Once | INTRAMUSCULAR | Status: AC
  Administered 2016-02-10: 150 mg via INTRAMUSCULAR

## 2016-02-10 NOTE — Progress Notes (Signed)
Patient presented to office today for her three month depo-provera injection. Patient tolerated well and will follow up in three months.

## 2016-04-27 ENCOUNTER — Ambulatory Visit (INDEPENDENT_AMBULATORY_CARE_PROVIDER_SITE_OTHER): Payer: Medicaid Other | Admitting: General Practice

## 2016-04-27 VITALS — BP 121/77 | HR 76 | Ht 67.0 in | Wt 245.0 lb

## 2016-04-27 DIAGNOSIS — Z3042 Encounter for surveillance of injectable contraceptive: Secondary | ICD-10-CM | POA: Diagnosis present

## 2016-04-27 MED ORDER — MEDROXYPROGESTERONE ACETATE 150 MG/ML IM SUSP
150.0000 mg | Freq: Once | INTRAMUSCULAR | Status: AC
  Administered 2016-04-27: 150 mg via INTRAMUSCULAR

## 2016-06-23 ENCOUNTER — Encounter: Payer: Self-pay | Admitting: Family Medicine

## 2016-07-13 ENCOUNTER — Ambulatory Visit: Payer: Medicaid Other

## 2016-07-19 ENCOUNTER — Ambulatory Visit (INDEPENDENT_AMBULATORY_CARE_PROVIDER_SITE_OTHER): Payer: Medicaid Other | Admitting: Family Medicine

## 2016-07-19 ENCOUNTER — Other Ambulatory Visit (HOSPITAL_COMMUNITY)
Admission: RE | Admit: 2016-07-19 | Discharge: 2016-07-19 | Disposition: A | Discharge status: Home / Self Care | Payer: Medicaid Other | Source: Ambulatory Visit | Attending: Family Medicine | Admitting: Family Medicine

## 2016-07-19 ENCOUNTER — Encounter: Payer: Self-pay | Admitting: Family Medicine

## 2016-07-19 VITALS — BP 127/81 | HR 94 | Ht 67.0 in | Wt 244.2 lb

## 2016-07-19 DIAGNOSIS — Z0000 Encounter for general adult medical examination without abnormal findings: Secondary | ICD-10-CM | POA: Diagnosis not present

## 2016-07-19 DIAGNOSIS — Z309 Encounter for contraceptive management, unspecified: Secondary | ICD-10-CM

## 2016-07-19 DIAGNOSIS — Z01419 Encounter for gynecological examination (general) (routine) without abnormal findings: Secondary | ICD-10-CM | POA: Insufficient documentation

## 2016-07-19 DIAGNOSIS — Z113 Encounter for screening for infections with a predominantly sexual mode of transmission: Secondary | ICD-10-CM | POA: Insufficient documentation

## 2016-07-19 DIAGNOSIS — Z3042 Encounter for surveillance of injectable contraceptive: Secondary | ICD-10-CM

## 2016-07-19 MED ORDER — MEDROXYPROGESTERONE ACETATE 150 MG/ML IM SUSP
150.0000 mg | Freq: Once | INTRAMUSCULAR | Status: AC
  Administered 2016-07-19: 150 mg via INTRAMUSCULAR

## 2016-07-19 NOTE — Progress Notes (Signed)
Subjective:     Alexandria Ross is a 26 y.o. female and is here for a comprehensive physical exam. The patient reports no problems.  Single monogamous, heterosexual relationship. On depo provera without any problems. Last PAP: 2-3 years ago. Mammogram: n/a  Social History   Social History  . Marital status: Single    Spouse name: N/A  . Number of children: N/A  . Years of education: N/A   Occupational History  . Not on file.   Social History Main Topics  . Smoking status: Current Some Day Smoker  . Smokeless tobacco: Never Used  . Alcohol use Yes     Comment: occ  . Drug use: No  . Sexual activity: Yes   Other Topics Concern  . Not on file   Social History Narrative  . No narrative on file   Health Maintenance  Topic Date Due  . HEMOGLOBIN A1C  07[birth year]04[birth year]88  . PNEUMOCOCCAL POLYSACCHARIDE VACCINE (1) 12/13/1990  . FOOT EXAM  12/13/1998  . OPHTHALMOLOGY EXAM  12/13/1998  . URINE MICROALBUMIN  12/13/1998  . PAP SMEAR  12/12/2009  . INFLUENZA VACCINE  12/16/2015  . TETANUS/TDAP  03/13/2023  . HIV Screening  Completed    The following portions of the patient's history were reviewed and updated as appropriate: allergies, current medications, past family history, past medical history, past social history, past surgical history and problem list.  Review of Systems Pertinent items noted in HPI and remainder of comprehensive ROS otherwise negative.   Objective:   General Appearance:    Alert, cooperative, no distress, appears stated age  Head:    Normocephalic, without obvious abnormality, atraumatic  Eyes:    PERRL, conjunctiva/corneas clear, EOM's intact, fundi    benign, both eyes  Neck:   Supple, symmetrical, trachea midline, no adenopathy;    thyroid:  no enlargement/tenderness/nodules; no carotid   bruit or JVD  Back:     Symmetric, no curvature, ROM normal, no CVA tenderness  Lungs:     Clear to auscultation bilaterally, respirations unlabored  Chest Wall:     No tenderness or deformity   Heart:    Regular rate and rhythm, S1 and S2 normal, no murmur, rub   or gallop  Breast Exam:    No tenderness, masses, or nipple abnormality  Abdomen:     Soft, non-tender, bowel sounds active all four quadrants,    no masses, no organomegaly  Genitalia:    Normal female without lesion, discharge or tenderness.         Vaginal rugae normal.  Cervix normal in appearance.  Uterus   normal in size.  Adnexa normal, no masses or fullness   palpated.  Extremities:   Extremities normal, atraumatic, no cyanosis or edema  Pulses:   2+ and symmetric all extremities  Skin:   Skin color, texture, turgor normal, no rashes or lesions  Lymph nodes:   Cervical, supraclavicular, and axillary nodes normal  Neurologic:   CNII-XII intact, normal strength, sensation and reflexes    throughout      Assessment:    Healthy female exam.      Plan:    PAP done. Discussed diet, exercise, breast exams. Declined STD testing. See After Visit Summary for Counseling Recommendations

## 2016-07-21 LAB — CYTOLOGY - PAP
CHLAMYDIA, DNA PROBE: POSITIVE — AB
Diagnosis: NEGATIVE
NEISSERIA GONORRHEA: NEGATIVE
TRICH (WINDOWPATH): NEGATIVE

## 2016-07-22 ENCOUNTER — Telehealth: Payer: Self-pay

## 2016-07-22 MED ORDER — AZITHROMYCIN 250 MG PO TABS
ORAL_TABLET | ORAL | 0 refills | Status: DC
Start: 2016-07-22 — End: 2017-10-28

## 2016-07-22 NOTE — Telephone Encounter (Signed)
Patient tested positive for Chlamydia infection. Left message for her to call us back regarding test results. I have called her rx into the pharmacy.

## 2016-07-23 ENCOUNTER — Telehealth: Payer: Self-pay | Admitting: *Deleted

## 2016-07-23 NOTE — Telephone Encounter (Signed)
Patient picked up rx from pharmacy. Want to make sure it is the correct prescription and find out what it is for.

## 2016-07-26 NOTE — Telephone Encounter (Addendum)
I called Bobbette back and notifiied her pap was negative for malignancy, gc,  but + for chlyamydia. We discussed we have sent in a med ( zithromax)to her pharmacy for her to take ; but that her partner should also be treated-and they should avoid intimate contact/ intercourse until 2 weeks after both are treated. She voices understanding. Will also send STD report to Health department

## 2016-10-05 ENCOUNTER — Ambulatory Visit (INDEPENDENT_AMBULATORY_CARE_PROVIDER_SITE_OTHER): Payer: Medicaid Other | Admitting: *Deleted

## 2016-10-05 VITALS — BP 114/74 | HR 80

## 2016-10-05 DIAGNOSIS — Z3042 Encounter for surveillance of injectable contraceptive: Secondary | ICD-10-CM | POA: Diagnosis not present

## 2016-10-05 MED ORDER — MEDROXYPROGESTERONE ACETATE 150 MG/ML IM SUSP
150.0000 mg | Freq: Once | INTRAMUSCULAR | Status: AC
  Administered 2016-10-05: 150 mg via INTRAMUSCULAR

## 2016-12-21 ENCOUNTER — Ambulatory Visit (INDEPENDENT_AMBULATORY_CARE_PROVIDER_SITE_OTHER): Payer: Medicaid Other | Admitting: General Practice

## 2016-12-21 VITALS — BP 119/78 | HR 73 | Ht 67.0 in | Wt 245.0 lb

## 2016-12-21 DIAGNOSIS — Z3042 Encounter for surveillance of injectable contraceptive: Secondary | ICD-10-CM

## 2016-12-21 DIAGNOSIS — Z308 Encounter for other contraceptive management: Secondary | ICD-10-CM

## 2016-12-21 MED ORDER — MEDROXYPROGESTERONE ACETATE 150 MG/ML IM SUSP
150.0000 mg | Freq: Once | INTRAMUSCULAR | Status: AC
  Administered 2016-12-21: 150 mg via INTRAMUSCULAR

## 2017-02-19 ENCOUNTER — Emergency Department (HOSPITAL_BASED_OUTPATIENT_CLINIC_OR_DEPARTMENT_OTHER)
Admission: EM | Admit: 2017-02-19 | Discharge: 2017-02-19 | Disposition: A | Discharge status: Home / Self Care | Payer: Medicaid Other | Attending: Emergency Medicine | Admitting: Emergency Medicine

## 2017-02-19 ENCOUNTER — Encounter (HOSPITAL_BASED_OUTPATIENT_CLINIC_OR_DEPARTMENT_OTHER): Payer: Self-pay | Admitting: Emergency Medicine

## 2017-02-19 DIAGNOSIS — Z87891 Personal history of nicotine dependence: Secondary | ICD-10-CM | POA: Insufficient documentation

## 2017-02-19 DIAGNOSIS — Z79899 Other long term (current) drug therapy: Secondary | ICD-10-CM | POA: Insufficient documentation

## 2017-02-19 DIAGNOSIS — H109 Unspecified conjunctivitis: Secondary | ICD-10-CM | POA: Insufficient documentation

## 2017-02-19 MED ORDER — NAPHAZOLINE-PHENIRAMINE 0.025-0.3 % OP SOLN
1.0000 [drp] | OPHTHALMIC | 0 refills | Status: DC | PRN
Start: 2017-02-19 — End: 2019-05-08

## 2017-02-19 MED ORDER — ERYTHROMYCIN 5 MG/GM OP OINT
TOPICAL_OINTMENT | Freq: Once | OPHTHALMIC | Status: AC
  Administered 2017-02-19: 1 via OPHTHALMIC
  Filled 2017-02-19: qty 3.5

## 2017-02-19 NOTE — ED Notes (Signed)
Pt given d/c instructions as per chart. Rx x 1. Verbalizes understanding. No questions. 

## 2017-02-19 NOTE — ED Provider Notes (Signed)
[facility] Provider Note   CSN: [id number] Arrival date & time: 02/19/17  1434     History   Chief Complaint Chief Complaint  Patient presents with  . Eye Problem    HPI Alexandria Ross is a 26 y.o. female who presents to the emergency department with a chief complaint of redness, itching, and grittiness to the left eye that began this morning. She reports the eye was crusted closed when she awoke. She reports that her significant other is employed by [employer], and was diagnosed with conjunctivitis earlier this week. She reports she used over-the-counter red eye itching relief this morning with minimal improvement. No fever, chills, pain with movement of the eye, or visual changes.  She does not wear glasses or contacts.  The history is provided by the patient. No language interpreter was used.    Past Medical History:  Diagnosis Date  . Abnormal Pap smear    ASC-US, HPV +  . Chlamydia 1 year ago  . Gestational diabetes     There are no active problems to display for this patient.   History reviewed. No pertinent surgical history.  OB History    Gravida Para Term Preterm AB Living   0 1 1   SAB TAB Ectopic Multiple Live Births   0 1 0 0 1       Home Medications    Prior to Admission medications   Medication Sig Start Date End Date Taking? Authorizing Provider  azithromycin (ZITHROMAX) 250 MG tablet Take 4 pills at once 07/22/16   Adam Phenix, MD  naphazoline-pheniramine (NAPHCON-A) 0.025-0.3 % ophthalmic solution Place 1 drop into the left eye every 4 (four) hours as needed for eye irritation. 02/19/17   Penina Reisner A, PA-C    Family History Family History  Problem Relation Age of Onset  . Diabetes Mother     Social History Social History  Substance Use Topics  . Smoking status: Former Games developer  . Smokeless tobacco: Never Used  . Alcohol use Yes     Comment: occ     Allergies   Patient has no known  allergies.   Review of Systems Review of Systems  Constitutional: Negative for chills and fever.  Eyes: Positive for pain, discharge, redness and itching. Negative for visual disturbance.   Physical Exam Updated Vital Signs BP 131/90 (BP Location: Left Arm)   Pulse 80   Temp 98 F (36.7 C) (Oral)   Resp 18   SpO2 99%   Physical Exam  Constitutional: No distress.  HENT:  Head: Normocephalic.  Eyes: EOM and lids are normal. Lids are everted and swept, no foreign bodies found. Right eye exhibits no chemosis, no discharge and no hordeolum. No foreign body present in the right eye. Left eye exhibits discharge. Left eye exhibits no chemosis and no hordeolum. No foreign body present in the left eye. Left conjunctiva is injected. Left conjunctiva has no hemorrhage. Left eye exhibits normal extraocular motion.  Neck: Neck supple.  Cardiovascular: Normal rate and regular rhythm.  Exam reveals no gallop and no friction rub.   No murmur heard. Pulmonary/Chest: Effort normal. No respiratory distress.  Abdominal: Soft. She exhibits no distension.  Neurological: She is alert.  Skin: Skin is warm. No rash noted.  Psychiatric: Her behavior is normal.  Nursing note and vitals reviewed.  ED Treatments / Results  Labs (all labs ordered are listed, but only abnormal results are displayed) Labs Reviewed - No  data to display  EKG  EKG Interpretation None       Radiology No results found.  Procedures Procedures (including critical care time)  Medications Ordered in ED Medications  erythromycin ophthalmic ointment (1 application Left Eye Given 02/19/17 1547)     Initial Impression / Assessment and Plan / ED Course  I have reviewed the triage vital signs and the nursing notes.  Pertinent labs & imaging results that were available during my care of the patient were reviewed by me and considered in my medical decision making (see chart for details).     Patient presentation  consistent with bacterial conjunctivitis.  No corneal abrasions, entrapment, or consensual photophobia. Presentation non-concerning for iritis, corneal abrasions, or HSV.  No antibiotics are indicated and patient will be prescribed naphazoline for itching.  Personal hygiene and frequent handwashing discussed.  Patient advised to followup with ophthalmologist if symptoms persist or worsen in any way including vision change or purulent discharge.  Patient verbalizes understanding and is agreeable with discharge.   Final Clinical Impressions(s) / ED Diagnoses   Final diagnoses:  Bacterial conjunctivitis of left eye    New Prescriptions Discharge Medication List as of 02/19/2017  3:47 PM    START taking these medications   Details  naphazoline-pheniramine (NAPHCON-A) 0.025-0.3 % ophthalmic solution Place 1 drop into the left eye every 4 (four) hours as needed for eye irritation., Starting Sat 02/19/2017, Print         Larinda Herter A, PA-C 02/19/17 1700    Cathren Laine, MD 02/20/17 [phone]

## 2017-02-19 NOTE — Discharge Instructions (Signed)
Apply a 0.5 inch of erythromycin ointment inside the lower eyelid 4 times daily (every 6 hours) for the next 5-7 days. You may place 1 drop of cefazolin into the eye every 4 hours to help with eye irritation.  It is important that you continue to wash her hands every time you touch your eye because pinkeye or bacterial conjunctivitis is very contagious.   If your symptoms do not start to improve in the next 5 days, please follow-up with your ophthalmologist. If you have severely worsening symptoms, including pain with movement of the eye, fever, chills, or severe vision changes, please return to the emergency department for re-evaluation.

## 2017-02-19 NOTE — ED Triage Notes (Signed)
Pt c/o LT eye red, itchy and painful since yesterday; reports was "crusty" this am

## 2017-03-08 ENCOUNTER — Ambulatory Visit (INDEPENDENT_AMBULATORY_CARE_PROVIDER_SITE_OTHER): Payer: Medicaid Other | Admitting: General Practice

## 2017-03-08 VITALS — BP 118/73 | HR 78 | Ht 67.0 in | Wt 245.0 lb

## 2017-03-08 DIAGNOSIS — Z3042 Encounter for surveillance of injectable contraceptive: Secondary | ICD-10-CM

## 2017-03-08 MED ORDER — MEDROXYPROGESTERONE ACETATE 150 MG/ML IM SUSP
150.0000 mg | Freq: Once | INTRAMUSCULAR | Status: AC
  Administered 2017-03-08: 150 mg via INTRAMUSCULAR

## 2017-03-08 NOTE — Progress Notes (Signed)
Agree with nursing staff's management of this patient's clinic encounter.  Vonzella NippleJulie Tekila Caillouet, PA-C 03/08/2017 9:26 AM

## 2017-05-27 ENCOUNTER — Ambulatory Visit (INDEPENDENT_AMBULATORY_CARE_PROVIDER_SITE_OTHER): Payer: Medicaid Other | Admitting: General Practice

## 2017-05-27 VITALS — BP 116/78 | HR 69 | Ht 67.0 in | Wt 248.0 lb

## 2017-05-27 DIAGNOSIS — Z3042 Encounter for surveillance of injectable contraceptive: Secondary | ICD-10-CM | POA: Diagnosis present

## 2017-05-27 MED ORDER — MEDROXYPROGESTERONE ACETATE 150 MG/ML IM SUSP
150.0000 mg | Freq: Once | INTRAMUSCULAR | Status: AC
  Administered 2017-05-27: 150 mg via INTRAMUSCULAR

## 2017-05-27 NOTE — Progress Notes (Signed)
Depoprovera given

## 2017-08-12 ENCOUNTER — Ambulatory Visit (INDEPENDENT_AMBULATORY_CARE_PROVIDER_SITE_OTHER): Payer: BLUE CROSS/BLUE SHIELD | Admitting: General Practice

## 2017-08-12 ENCOUNTER — Ambulatory Visit: Payer: Medicaid Other

## 2017-08-12 VITALS — BP 131/79 | HR 76 | Ht 67.0 in | Wt 254.0 lb

## 2017-08-12 DIAGNOSIS — Z3042 Encounter for surveillance of injectable contraceptive: Secondary | ICD-10-CM | POA: Diagnosis not present

## 2017-08-12 MED ORDER — MEDROXYPROGESTERONE ACETATE 150 MG/ML IM SUSP
150.0000 mg | Freq: Once | INTRAMUSCULAR | Status: AC
  Administered 2017-08-12: 150 mg via INTRAMUSCULAR

## 2017-08-12 NOTE — Progress Notes (Signed)
Gerri Linsourtney Lombardozzi here for Depo-Provera  Injection.  Injection administered without complication. Patient will return in 3 months for next injection.  Marylynn PearsonCarrie Keirsten Matuska, RN 08/12/2017  11:16 AM

## 2017-10-28 ENCOUNTER — Ambulatory Visit (INDEPENDENT_AMBULATORY_CARE_PROVIDER_SITE_OTHER): Payer: BLUE CROSS/BLUE SHIELD

## 2017-10-28 VITALS — BP 114/84 | HR 72 | Wt 250.0 lb

## 2017-10-28 DIAGNOSIS — Z3042 Encounter for surveillance of injectable contraceptive: Secondary | ICD-10-CM | POA: Diagnosis not present

## 2017-10-28 MED ORDER — MEDROXYPROGESTERONE ACETATE 150 MG/ML IM SUSP
150.0000 mg | Freq: Once | INTRAMUSCULAR | Status: AC
  Administered 2017-10-28: 150 mg via INTRAMUSCULAR

## 2017-10-28 MED ORDER — MEDROXYPROGESTERONE ACETATE 150 MG/ML IM SUSP: 150.0000 mg | INTRAMUSCULAR | 0 refills | Status: DC

## 2017-10-28 NOTE — Progress Notes (Signed)
Date last pap: 07/19/2016. Last Depo-Provera: 08/12/2017. Side Effects if any: n/a. Serum HCG indicated? n/a. Depo-Provera 150 mg IM given by: Darrol AngelShannon Thalia Turkington, CMA(AAMA). Next appointment due Aug 30 -Sept 13.

## 2018-01-13 ENCOUNTER — Ambulatory Visit (INDEPENDENT_AMBULATORY_CARE_PROVIDER_SITE_OTHER): Payer: BLUE CROSS/BLUE SHIELD | Admitting: *Deleted

## 2018-01-13 VITALS — BP 128/86 | HR 83 | Ht 67.0 in | Wt 248.0 lb

## 2018-01-13 DIAGNOSIS — Z3042 Encounter for surveillance of injectable contraceptive: Secondary | ICD-10-CM

## 2018-01-13 MED ORDER — MEDROXYPROGESTERONE ACETATE 150 MG/ML IM SUSP
150.0000 mg | Freq: Once | INTRAMUSCULAR | Status: AC
  Administered 2018-01-13: 150 mg via INTRAMUSCULAR

## 2018-01-13 NOTE — Progress Notes (Signed)
Depo Provera administered as scheduled. Pt tolerated well.  Last Gyn exam was 07/19/16. She was advised she will need Annual Gyn exam with next injection 11/15-11/29.  Pt voiced understanding.

## 2018-01-17 NOTE — Progress Notes (Signed)
I agree with the nurses note and plan of care.  Venia Carbon I, NP 01/17/2018 8:12 AM

## 2018-03-27 ENCOUNTER — Encounter: Payer: Self-pay | Admitting: Family Medicine

## 2018-03-27 ENCOUNTER — Other Ambulatory Visit (HOSPITAL_COMMUNITY)
Admission: RE | Admit: 2018-03-27 | Discharge: 2018-03-27 | Disposition: A | Discharge status: Home / Self Care | Payer: BLUE CROSS/BLUE SHIELD | Source: Ambulatory Visit | Attending: Family Medicine | Admitting: Family Medicine

## 2018-03-27 ENCOUNTER — Ambulatory Visit (INDEPENDENT_AMBULATORY_CARE_PROVIDER_SITE_OTHER): Payer: BLUE CROSS/BLUE SHIELD | Admitting: Family Medicine

## 2018-03-27 VITALS — BP 125/87 | HR 90 | Ht 67.0 in | Wt 241.8 lb

## 2018-03-27 DIAGNOSIS — Z3042 Encounter for surveillance of injectable contraceptive: Secondary | ICD-10-CM | POA: Diagnosis not present

## 2018-03-27 DIAGNOSIS — Z01419 Encounter for gynecological examination (general) (routine) without abnormal findings: Secondary | ICD-10-CM | POA: Insufficient documentation

## 2018-03-27 MED ORDER — MEDROXYPROGESTERONE ACETATE 150 MG/ML IM SUSP
150.0000 mg | Freq: Once | INTRAMUSCULAR | Status: AC
  Administered 2018-03-27: 150 mg via INTRAMUSCULAR

## 2018-03-27 NOTE — Addendum Note (Signed)
Addended by: Osvaldo Human on: 03/27/2018 03:32 PM   Modules accepted: Orders

## 2018-03-27 NOTE — Addendum Note (Signed)
Addended by: Gerome Apley on: 03/27/2018 03:19 PM   Modules accepted: Orders

## 2018-03-27 NOTE — Progress Notes (Signed)
GYNECOLOGY ANNUAL PREVENTATIVE CARE ENCOUNTER NOTE  Subjective:   Alexandria Ross is a 27 y.o. G9P1011 female here for a routine annual gynecologic exam.  Current complaints: none.   Denies abnormal vaginal bleeding, discharge, pelvic pain, problems with intercourse or other gynecologic concerns.    Not having periods secondary to Depo  Gynecologic History No LMP recorded (lmp unknown). Patient has had an injection. Patient is sexually active  Contraception: Depo-Provera injections Last Pap: 2018. Results were: normal Last mammogram: n/a.   Obstetric History OB History  Gravida Para Term Preterm AB Living  2 1 1  0 1 1  SAB TAB Ectopic Multiple Live Births  0 1 0 0 1    # Outcome Date GA Lbr Len/2nd Weight Sex Delivery Anes PTL Lv  2 Term 03/30/13 [redacted]w[redacted]d 07:06 / 01:54 6 lb 11.8 oz (3.056 kg) F Vag-Spont EPI  LIV     Birth Comments: none  1 TAB 2013            Past Medical History:  Diagnosis Date  . Abnormal Pap smear    ASC-US, HPV +  . Chlamydia 1 year ago  . Gestational diabetes     History reviewed. No pertinent surgical history.  Current Outpatient Medications on File Prior to Visit  Medication Sig Dispense Refill  . naphazoline-pheniramine (NAPHCON-A) 0.025-0.3 % ophthalmic solution Place 1 drop into the left eye every 4 (four) hours as needed for eye irritation. 15 mL 0   No current facility-administered medications on file prior to visit.     No Known Allergies  Social History   Socioeconomic History  . Marital status: Single    Spouse name: Not on file  . Number of children: Not on file  . Years of education: Not on file  . Highest education level: Not on file  Occupational History  . Not on file  Social Needs  . Financial resource strain: Not on file  . Food insecurity:    Worry: Not on file    Inability: Not on file  . Transportation needs:    Medical: Not on file    Non-medical: Not on file  Tobacco Use  . Smoking status: Former Games developer    . Smokeless tobacco: Never Used  Substance and Sexual Activity  . Alcohol use: Yes    Comment: occ wine  . Drug use: No  . Sexual activity: Yes    Birth control/protection: Injection  Lifestyle  . Physical activity:    Days per week: Not on file    Minutes per session: Not on file  . Stress: Not on file  Relationships  . Social connections:    Talks on phone: Not on file    Gets together: Not on file    Attends religious service: Not on file    Active member of club or organization: Not on file    Attends meetings of clubs or organizations: Not on file    Relationship status: Not on file  . Intimate partner violence:    Fear of current or ex partner: Not on file    Emotionally abused: Not on file    Physically abused: Not on file    Forced sexual activity: Not on file  Other Topics Concern  . Not on file  Social History Narrative  . Not on file    Family History  Problem Relation Age of Onset  . Diabetes Mother     The following portions of the patient's history were  reviewed and updated as appropriate: allergies, current medications, past family history, past medical history, past social history, past surgical history and problem list.  Review of Systems Pertinent items noted in HPI and remainder of comprehensive ROS otherwise negative.   Objective:  BP 125/87   Pulse 90   Ht 5\' 7"  (1.702 m)   Wt 241 lb 12.8 oz (109.7 kg)   LMP  (LMP Unknown)   BMI 37.87 kg/m  CONSTITUTIONAL: Well-developed, well-nourished female in no acute distress.  HENT:  Normocephalic, atraumatic, External right and left ear normal. Oropharynx is clear and moist EYES: Conjunctivae and EOM are normal. Pupils are equal, round, and reactive to light. No scleral icterus.  NECK: Normal range of motion, supple, no masses.  Normal thyroid.   CARDIOVASCULAR: Normal heart rate noted, regular rhythm RESPIRATORY: Clear to auscultation bilaterally. Effort and breath sounds normal, no problems with  respiration noted. BREASTS: Symmetric in size. No masses, skin changes, nipple drainage, or lymphadenopathy. ABDOMEN: Soft, normal bowel sounds, no distention noted.  No tenderness, rebound or guarding.  PELVIC: Normal appearing external genitalia; normal appearing vaginal mucosa and cervix.  No abnormal discharge noted.  Normal uterine size, no other palpable masses, no uterine or adnexal tenderness. MUSCULOSKELETAL: Normal range of motion. No tenderness.  No cyanosis, clubbing, or edema.  2+ distal pulses. SKIN: Skin is warm and dry. No rash noted. Not diaphoretic. No erythema. No pallor. NEUROLOGIC: Alert and oriented to person, place, and time. Normal reflexes, muscle tone coordination. No cranial nerve deficit noted. PSYCHIATRIC: Normal mood and affect. Normal behavior. Normal judgment and thought content.  Assessment:  Annual gynecologic examination with pap smear   Plan:  1. Well Woman Exam Will follow up results of pap smear and manage accordingly. STD testing discussed. Patient requested testing - vaginal cultures  2. Surveillance for Depo-Provera contraception Continue Depo injections.   Routine preventative health maintenance measures emphasized. Please refer to After Visit Summary for other counseling recommendations.    Candelaria Celeste, DO [facility]

## 2018-03-30 LAB — CYTOLOGY - PAP
Bacterial vaginitis: POSITIVE — AB
CANDIDA VAGINITIS: NEGATIVE
Chlamydia: NEGATIVE
Diagnosis: NEGATIVE
NEISSERIA GONORRHEA: NEGATIVE
Trichomonas: NEGATIVE

## 2018-03-31 ENCOUNTER — Other Ambulatory Visit: Payer: Self-pay | Admitting: Family Medicine

## 2018-03-31 MED ORDER — METRONIDAZOLE 500 MG PO TABS: 500.0000 mg | ORAL_TABLET | Freq: Two times a day (BID) | ORAL | 0 refills | Status: DC

## 2018-03-31 MED ORDER — FLUCONAZOLE 150 MG PO TABS: 150.0000 mg | ORAL_TABLET | Freq: Once | ORAL | 0 refills | Status: AC

## 2018-06-12 ENCOUNTER — Ambulatory Visit (INDEPENDENT_AMBULATORY_CARE_PROVIDER_SITE_OTHER): Payer: BLUE CROSS/BLUE SHIELD | Admitting: Emergency Medicine

## 2018-06-12 VITALS — BP 131/84 | HR 81 | Ht 67.0 in | Wt 244.5 lb

## 2018-06-12 DIAGNOSIS — Z3042 Encounter for surveillance of injectable contraceptive: Secondary | ICD-10-CM

## 2018-06-12 MED ORDER — MEDROXYPROGESTERONE ACETATE 150 MG/ML IM SUSP
150.0000 mg | Freq: Once | INTRAMUSCULAR | Status: AC
  Administered 2018-06-12: 150 mg via INTRAMUSCULAR

## 2018-06-12 NOTE — Progress Notes (Signed)
Agree with A & P. 

## 2018-06-12 NOTE — Progress Notes (Signed)
Alexandria Ross here for Depo-Provera  Injection.  Injection administered without complication. Patient will return in 3 months for next injection.  Nena Alexander, RN 06/12/2018  9:42 AM

## 2018-08-24 ENCOUNTER — Telehealth: Payer: Self-pay

## 2018-08-24 NOTE — Telephone Encounter (Signed)
Called pt to advise of new address/no answer, left VM.

## 2018-08-29 ENCOUNTER — Ambulatory Visit (INDEPENDENT_AMBULATORY_CARE_PROVIDER_SITE_OTHER): Payer: BLUE CROSS/BLUE SHIELD

## 2018-08-29 ENCOUNTER — Other Ambulatory Visit: Payer: Self-pay

## 2018-08-29 VITALS — BP 114/74 | HR 78

## 2018-08-29 DIAGNOSIS — Z3042 Encounter for surveillance of injectable contraceptive: Secondary | ICD-10-CM | POA: Diagnosis not present

## 2018-08-29 DIAGNOSIS — Z3202 Encounter for pregnancy test, result negative: Secondary | ICD-10-CM

## 2018-08-29 LAB — POCT PREGNANCY, URINE: Preg Test, Ur: NEGATIVE

## 2018-08-29 MED ORDER — MEDROXYPROGESTERONE ACETATE 150 MG/ML IM SUSP
150.0000 mg | Freq: Once | INTRAMUSCULAR | Status: AC
  Administered 2018-08-29: 150 mg via INTRAMUSCULAR

## 2018-08-29 NOTE — Progress Notes (Signed)
Alexandria Ross here for Depo-Provera  Injection.  Injection administered without complication. Patient will return in 3 months for next injection.  Ralene Bathe, RN 08/29/2018  10:33 AM

## 2018-08-30 NOTE — Progress Notes (Signed)
Chart reviewed for nurse visit. Agree with plan of care.   Marylene Land, CNM 08/30/2018 1:40 PM

## 2018-11-13 ENCOUNTER — Telehealth: Payer: Self-pay | Admitting: Family Medicine

## 2018-11-13 NOTE — Telephone Encounter (Signed)
Attempted to call patient about her appointment on 6/30 @ 2:00. No answer, left voicemail instructing patient to wear a face mask for the entire appointment. Patient instructed that no visitors are allowed with her. Patient was not screened due to no answer. Patient instructed if experiencing any symptoms to not attend the appointment. Office number and symptoms left.

## 2018-11-14 ENCOUNTER — Ambulatory Visit (INDEPENDENT_AMBULATORY_CARE_PROVIDER_SITE_OTHER): Payer: BC Managed Care – PPO

## 2018-11-14 ENCOUNTER — Other Ambulatory Visit: Payer: Self-pay

## 2018-11-14 DIAGNOSIS — Z3042 Encounter for surveillance of injectable contraceptive: Secondary | ICD-10-CM | POA: Diagnosis not present

## 2018-11-14 MED ORDER — MEDROXYPROGESTERONE ACETATE 150 MG/ML IM SUSP
150.0000 mg | Freq: Once | INTRAMUSCULAR | Status: AC
  Administered 2018-11-14: 150 mg via INTRAMUSCULAR

## 2018-11-14 NOTE — Progress Notes (Signed)
I have reviewed the chart and agree with nursing staff's documentation of this patient's encounter.  Sawsan Riggio, CNM 11/14/2018 5:11 PM    

## 2018-11-14 NOTE — Progress Notes (Signed)
Alexandria Ross here for Depo-Provera  Injection.  Injection administered without complication. Patient will return in 3 months for next injection.  Verdell Carmine, RN 11/14/2018  2:12 PM

## 2019-01-29 ENCOUNTER — Telehealth: Payer: Self-pay | Admitting: Obstetrics and Gynecology

## 2019-01-29 NOTE — Telephone Encounter (Signed)
Called the patient to confirm the upcoming appointment. Left a detailed voicemail of wearing a face mask, no children or visitors due to covid19 restrictions. Also instructed if the patient has been in contact with someone who has had covid19, if she has been diagnosed with covid19, or experienced any flu-like symptoms we ask that you please call our office to reschedule. °

## 2019-01-30 ENCOUNTER — Ambulatory Visit (INDEPENDENT_AMBULATORY_CARE_PROVIDER_SITE_OTHER): Payer: BC Managed Care – PPO | Admitting: *Deleted

## 2019-01-30 ENCOUNTER — Other Ambulatory Visit: Payer: Self-pay

## 2019-01-30 VITALS — BP 120/85 | HR 80 | Temp 98.7°F | Ht 67.0 in | Wt 233.6 lb

## 2019-01-30 DIAGNOSIS — Z3042 Encounter for surveillance of injectable contraceptive: Secondary | ICD-10-CM

## 2019-01-30 MED ORDER — MEDROXYPROGESTERONE ACETATE 150 MG/ML IM SUSP
150.0000 mg | Freq: Once | INTRAMUSCULAR | Status: AC
  Administered 2019-01-30: 15:00:00 150 mg via INTRAMUSCULAR

## 2019-01-30 NOTE — Progress Notes (Signed)
Depo Provera 150 mg IM administered as scheduled. Pt tolerated well. Next injection due 12/1-12/15. Pt advised that she will need Annual Gyn exam at that time as well. She voiced understanding.

## 2019-01-30 NOTE — Progress Notes (Signed)
Chart reviewed for nurse visit. Agree with plan of care.   Clarnce Flock, MD 01/30/2019 7:36 PM

## 2019-04-11 ENCOUNTER — Ambulatory Visit: Payer: BC Managed Care – PPO | Admitting: Advanced Practice Midwife

## 2019-04-17 ENCOUNTER — Ambulatory Visit (INDEPENDENT_AMBULATORY_CARE_PROVIDER_SITE_OTHER): Payer: BC Managed Care – PPO

## 2019-04-17 ENCOUNTER — Other Ambulatory Visit: Payer: Self-pay

## 2019-04-17 VITALS — BP 110/78 | HR 63 | Wt 230.7 lb

## 2019-04-17 DIAGNOSIS — Z3042 Encounter for surveillance of injectable contraceptive: Secondary | ICD-10-CM

## 2019-04-17 MED ORDER — MEDROXYPROGESTERONE ACETATE 150 MG/ML IM SUSP
150.0000 mg | Freq: Once | INTRAMUSCULAR | Status: AC
  Administered 2019-04-17: 150 mg via INTRAMUSCULAR

## 2019-04-17 NOTE — Progress Notes (Signed)
Alexandria Ross here for Depo-Provera  Injection.  Injection administered without complication. Patient will return in 3 months for next injection.  Verdell Carmine, RN 04/17/2019  1:33 PM

## 2019-04-17 NOTE — Progress Notes (Signed)
Patient seen and assessed by nursing staff during this encounter. I have reviewed the chart and agree with the documentation and plan.  Marcille Buffy DNP, CNM  04/17/19  5:05 PM

## 2019-05-08 ENCOUNTER — Encounter: Payer: Self-pay | Admitting: Advanced Practice Midwife

## 2019-05-08 ENCOUNTER — Other Ambulatory Visit: Payer: Self-pay

## 2019-05-08 ENCOUNTER — Ambulatory Visit (INDEPENDENT_AMBULATORY_CARE_PROVIDER_SITE_OTHER): Payer: BC Managed Care – PPO | Admitting: Advanced Practice Midwife

## 2019-05-08 VITALS — BP 114/74 | HR 77 | Temp 98.6°F | Ht 67.0 in | Wt 234.8 lb

## 2019-05-08 DIAGNOSIS — Z01419 Encounter for gynecological examination (general) (routine) without abnormal findings: Secondary | ICD-10-CM

## 2019-05-08 NOTE — Progress Notes (Signed)
GYNECOLOGY ANNUAL PREVENTATIVE CARE ENCOUNTER NOTE  Subjective:   Alexandria Ross is a 28 y.o. G60P1011 female here for a routine annual gynecologic exam.  Current complaints: none.   Denies abnormal vaginal bleeding, discharge, pelvic pain, problems with intercourse or other gynecologic concerns.    Gynecologic History No LMP recorded. Patient has had an injection. No menses due to depo, occasional spotting.  Contraception: Depo-Provera injections patient is happy with this birth control.  Last Pap: 03/27/2018. Results were: normal Last mammogram: NA, age. Results were: NA  Obstetric History OB History  Gravida Para Term Preterm AB Living  2 1 1  0 1 1  SAB TAB Ectopic Multiple Live Births  0 1 0 0 1    # Outcome Date GA Lbr Len/2nd Weight Sex Delivery Anes PTL Lv  2 Term 03/30/13 [redacted]w[redacted]d 07:06 / 01:54 6 lb 11.8 oz (3.056 kg) F Vag-Spont EPI  LIV     Birth Comments: none  1 TAB 2013            Past Medical History:  Diagnosis Date  . Abnormal Pap smear    ASC-US, HPV +  . Chlamydia 1 year ago  . Gestational diabetes     No past surgical history on file.  Current Outpatient Medications on File Prior to Visit  Medication Sig Dispense Refill  . naphazoline-pheniramine (NAPHCON-A) 0.025-0.3 % ophthalmic solution Place 1 drop into the left eye every 4 (four) hours as needed for eye irritation. 15 mL 0   No current facility-administered medications on file prior to visit.    No Known Allergies  Social History   Socioeconomic History  . Marital status: Single    Spouse name: Not on file  . Number of children: Not on file  . Years of education: Not on file  . Highest education level: Not on file  Occupational History  . Not on file  Tobacco Use  . Smoking status: Former Research scientist (life sciences)  . Smokeless tobacco: Never Used  Substance and Sexual Activity  . Alcohol use: Yes    Comment: occ wine  . Drug use: No  . Sexual activity: Yes    Birth control/protection: Injection  Other  Topics Concern  . Not on file  Social History Narrative  . Not on file   Social Determinants of Health   Financial Resource Strain:   . Difficulty of Paying Living Expenses: Not on file  Food Insecurity:   . Worried About Charity fundraiser in the Last Year: Not on file  . Ran Out of Food in the Last Year: Not on file  Transportation Needs:   . Lack of Transportation (Medical): Not on file  . Lack of Transportation (Non-Medical): Not on file  Physical Activity:   . Days of Exercise per Week: Not on file  . Minutes of Exercise per Session: Not on file  Stress:   . Feeling of Stress : Not on file  Social Connections:   . Frequency of Communication with Friends and Family: Not on file  . Frequency of Social Gatherings with Friends and Family: Not on file  . Attends Religious Services: Not on file  . Active Member of Clubs or Organizations: Not on file  . Attends Archivist Meetings: Not on file  . Marital Status: Not on file  Intimate Partner Violence:   . Fear of Current or Ex-Partner: Not on file  . Emotionally Abused: Not on file  . Physically Abused: Not on file  . Sexually  Abused: Not on file    Family History  Problem Relation Age of Onset  . Diabetes Mother     The following portions of the patient's history were reviewed and updated as appropriate: allergies, current medications, past family history, past medical history, past social history, past surgical history and problem list.  Review of Systems Pertinent items noted in HPI and remainder of comprehensive ROS otherwise negative.   Objective:  BP 114/74   Pulse 77   Temp 98.6 F (37 C)   Ht 5\' 7"  (1.702 m)   Wt 234 lb 12.8 oz (106.5 kg)   BMI 36.77 kg/m   Physical Exam  Constitutional: She is oriented to person, place, and time and well-developed, well-nourished, and in no distress. No distress.  HENT:  Head: Normocephalic.  Cardiovascular: Normal rate.  Pulmonary/Chest: Effort normal.  Right breast exhibits no inverted nipple, no mass, no nipple discharge, no skin change and no tenderness. Left breast exhibits no inverted nipple, no mass, no nipple discharge, no skin change and no tenderness.  Abdominal: Soft. There is no abdominal tenderness.  Genitourinary:    Genitourinary Comments:  External: no lesion Vagina: small amount of white discharge Cervix:  no CMT Uterus: NSSC Adnexa: NT    Neurological: She is alert and oriented to person, place, and time.  Skin: Skin is warm and dry.  Psychiatric: Affect normal.  Nursing note and vitals reviewed.   Assessment and Plan:  1. Well woman exam with routine gynecological exam - Pap not due at this time - Continue depo - Patient declines STD testing today - Next depo due 07/04/19-07/18/19   Routine preventative health maintenance measures emphasized. Please refer to After Visit Summary for other counseling recommendations.   09/17/19 DNP, CNM  05/08/19  4:26 PM

## 2019-05-15 DIAGNOSIS — B9689 Other specified bacterial agents as the cause of diseases classified elsewhere: Secondary | ICD-10-CM

## 2019-05-15 DIAGNOSIS — N760 Acute vaginitis: Secondary | ICD-10-CM

## 2019-05-15 MED ORDER — METRONIDAZOLE 500 MG PO TABS: 500.0000 mg | ORAL_TABLET | Freq: Two times a day (BID) | ORAL | 0 refills | Status: DC

## 2019-07-03 ENCOUNTER — Other Ambulatory Visit: Payer: Self-pay

## 2019-07-03 ENCOUNTER — Ambulatory Visit (INDEPENDENT_AMBULATORY_CARE_PROVIDER_SITE_OTHER): Payer: BC Managed Care – PPO

## 2019-07-03 VITALS — BP 108/77 | HR 91 | Wt 233.6 lb

## 2019-07-03 DIAGNOSIS — Z3042 Encounter for surveillance of injectable contraceptive: Secondary | ICD-10-CM | POA: Diagnosis not present

## 2019-07-03 MED ORDER — MEDROXYPROGESTERONE ACETATE 150 MG/ML IM SUSP
150.0000 mg | Freq: Once | INTRAMUSCULAR | Status: AC
  Administered 2019-07-03: 11:00:00 150 mg via INTRAMUSCULAR

## 2019-07-03 NOTE — Progress Notes (Signed)
Alexandria Ross here for Depo-Provera  Injection.  Injection administered without complication. Patient will return in 3 months for next injection.  Pt reports feeling a new bump on her left upper back. Assessed the area. No visible changes. Able to palpate a small nodule beneath the skin. No pain associated with the area; not tender to the touch. No similar history, no past dermatology history. Pt reports having primary care. Instructed pt to make an appt to discuss further. Pt states she will call when she leaves our office.  Marjo Bicker, RN 07/03/2019  10:16 AM

## 2019-07-03 NOTE — Progress Notes (Signed)
Chart reviewed for nurse visit. Agree with plan of care.   Sharyon Cable, CNM 07/03/2019 10:46 AM

## 2019-09-09 ENCOUNTER — Other Ambulatory Visit: Payer: Self-pay

## 2019-09-09 ENCOUNTER — Emergency Department (HOSPITAL_BASED_OUTPATIENT_CLINIC_OR_DEPARTMENT_OTHER)
Admission: EM | Admit: 2019-09-09 | Discharge: 2019-09-09 | Disposition: A | Discharge status: Home / Self Care | Payer: BC Managed Care – PPO | Attending: Emergency Medicine | Admitting: Emergency Medicine

## 2019-09-09 ENCOUNTER — Emergency Department (HOSPITAL_BASED_OUTPATIENT_CLINIC_OR_DEPARTMENT_OTHER): Payer: BC Managed Care – PPO

## 2019-09-09 ENCOUNTER — Encounter (HOSPITAL_BASED_OUTPATIENT_CLINIC_OR_DEPARTMENT_OTHER): Payer: Self-pay | Admitting: *Deleted

## 2019-09-09 DIAGNOSIS — S50871A Other superficial bite of right forearm, initial encounter: Secondary | ICD-10-CM | POA: Insufficient documentation

## 2019-09-09 DIAGNOSIS — Z23 Encounter for immunization: Secondary | ICD-10-CM | POA: Insufficient documentation

## 2019-09-09 DIAGNOSIS — Y999 Unspecified external cause status: Secondary | ICD-10-CM | POA: Insufficient documentation

## 2019-09-09 DIAGNOSIS — S51851A Open bite of right forearm, initial encounter: Secondary | ICD-10-CM | POA: Diagnosis not present

## 2019-09-09 DIAGNOSIS — W503XXA Accidental bite by another person, initial encounter: Secondary | ICD-10-CM

## 2019-09-09 DIAGNOSIS — Y9389 Activity, other specified: Secondary | ICD-10-CM | POA: Insufficient documentation

## 2019-09-09 DIAGNOSIS — M7989 Other specified soft tissue disorders: Secondary | ICD-10-CM | POA: Diagnosis not present

## 2019-09-09 DIAGNOSIS — Y929 Unspecified place or not applicable: Secondary | ICD-10-CM | POA: Insufficient documentation

## 2019-09-09 DIAGNOSIS — Z87891 Personal history of nicotine dependence: Secondary | ICD-10-CM | POA: Diagnosis not present

## 2019-09-09 LAB — PREGNANCY, URINE: Preg Test, Ur: NEGATIVE

## 2019-09-09 MED ORDER — BACITRACIN ZINC 500 UNIT/GM EX OINT
TOPICAL_OINTMENT | Freq: Once | CUTANEOUS | Status: AC
  Administered 2019-09-09: 1 via TOPICAL
  Filled 2019-09-09: qty 28.35

## 2019-09-09 MED ORDER — AMOXICILLIN-POT CLAVULANATE 875-125 MG PO TABS
1.0000 | ORAL_TABLET | Freq: Once | ORAL | Status: AC
  Administered 2019-09-09: 15:00:00 1 via ORAL
  Filled 2019-09-09: qty 1

## 2019-09-09 MED ORDER — TETANUS-DIPHTH-ACELL PERTUSSIS 5-2.5-18.5 LF-MCG/0.5 IM SUSP
0.5000 mL | Freq: Once | INTRAMUSCULAR | Status: AC
  Administered 2019-09-09: 0.5 mL via INTRAMUSCULAR
  Filled 2019-09-09: qty 0.5

## 2019-09-09 MED ORDER — AMOXICILLIN-POT CLAVULANATE 875-125 MG PO TABS: 1.0000 | ORAL_TABLET | Freq: Two times a day (BID) | ORAL | 0 refills | Status: AC

## 2019-09-09 NOTE — Discharge Instructions (Signed)
Gently clean the wound with soap and water. Make sure to pat dry the wound before covering it with any dressing. You can use topical antibiotic ointment and bandage. Ice and elevate for pain relief.   You can take Tylenol or Ibuprofen as directed for pain. You can alternate Tylenol and Ibuprofen every 4 hours for additional pain relief.   Take antibiotics as directed. Please take all of your antibiotics until finished.  Monitor closely for any signs of infection. Return to the Emergency Department for any worsening redness/swelling of the area that begins to spread, drainage from the site, worsening pain, fever or any other worsening or concerning symptoms.   
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
## 2019-09-18 ENCOUNTER — Ambulatory Visit: Payer: BC Managed Care – PPO

## 2019-09-26 ENCOUNTER — Ambulatory Visit (INDEPENDENT_AMBULATORY_CARE_PROVIDER_SITE_OTHER): Payer: BC Managed Care – PPO | Admitting: *Deleted

## 2019-09-26 ENCOUNTER — Other Ambulatory Visit: Payer: Self-pay

## 2019-09-26 VITALS — BP 136/83 | HR 78

## 2019-09-26 DIAGNOSIS — Z3042 Encounter for surveillance of injectable contraceptive: Secondary | ICD-10-CM

## 2019-09-26 MED ORDER — MEDROXYPROGESTERONE ACETATE 150 MG/ML IM SUSP
150.0000 mg | Freq: Once | INTRAMUSCULAR | Status: AC
Start: 2019-09-26 — End: 2019-09-26
  Administered 2019-09-26: 150 mg via INTRAMUSCULAR

## 2019-09-26 NOTE — Progress Notes (Signed)
Here for depo- provera injection. Tolerated without complaint. Instructed to schedule next injection appointment on discharge. Brylea Pita,RN

## 2019-09-26 NOTE — Progress Notes (Signed)
Patient seen and assessed by nursing staff.  Agree with documentation and plan.  

## 2019-12-10 ENCOUNTER — Ambulatory Visit: Payer: BC Managed Care – PPO

## 2019-12-17 DIAGNOSIS — Z20822 Contact with and (suspected) exposure to covid-19: Secondary | ICD-10-CM | POA: Diagnosis not present

## 2019-12-18 ENCOUNTER — Other Ambulatory Visit: Payer: BC Managed Care – PPO

## 2019-12-19 ENCOUNTER — Ambulatory Visit (INDEPENDENT_AMBULATORY_CARE_PROVIDER_SITE_OTHER): Payer: BC Managed Care – PPO | Admitting: General Practice

## 2019-12-19 ENCOUNTER — Other Ambulatory Visit: Payer: Self-pay

## 2019-12-19 VITALS — BP 115/80 | HR 96 | Ht 67.0 in | Wt 232.0 lb

## 2019-12-19 DIAGNOSIS — Z3042 Encounter for surveillance of injectable contraceptive: Secondary | ICD-10-CM | POA: Diagnosis not present

## 2019-12-19 MED ORDER — MEDROXYPROGESTERONE ACETATE 150 MG/ML IM SUSP
150.0000 mg | Freq: Once | INTRAMUSCULAR | Status: AC
  Administered 2019-12-19: 150 mg via INTRAMUSCULAR

## 2019-12-19 NOTE — Progress Notes (Signed)
Alexandria Ross here for Depo-Provera  Injection.  Injection administered without complication. Patient will return in 3 months for next injection.  Marylynn Pearson, RN 12/19/2019  2:07 PM

## 2019-12-19 NOTE — Progress Notes (Signed)
Patient was assessed and managed by nursing staff during this encounter. I have reviewed the chart and agree with the documentation and plan. I have also made any necessary editorial changes.  Jaynie Collins, MD 12/19/2019 4:43 PM

## 2020-03-05 ENCOUNTER — Ambulatory Visit (INDEPENDENT_AMBULATORY_CARE_PROVIDER_SITE_OTHER): Payer: Medicaid Other | Admitting: *Deleted

## 2020-03-05 ENCOUNTER — Other Ambulatory Visit: Payer: Self-pay

## 2020-03-05 VITALS — BP 109/75 | HR 90 | Ht 67.0 in | Wt 231.5 lb

## 2020-03-05 DIAGNOSIS — Z3042 Encounter for surveillance of injectable contraceptive: Secondary | ICD-10-CM

## 2020-03-05 MED ORDER — MEDROXYPROGESTERONE ACETATE 150 MG/ML IM SUSP
150.0000 mg | Freq: Once | INTRAMUSCULAR | Status: AC
  Administered 2020-03-05: 150 mg via INTRAMUSCULAR

## 2020-03-05 NOTE — Progress Notes (Signed)
Alexandria Ross here for Depo-Provera Injection. Injection administered without complication. Patient will return in 3 months for next injection between 05/21/20 and 06/04/2020. Next annual visit due after 05/07/20.   Aylinn Rydberg,RN 03/05/2020  9:16 AM

## 2020-03-05 NOTE — Progress Notes (Signed)
I have reviewed this chart and agree with the RN/CMA assessment and management.    K. Meryl Samuel Rittenhouse, M.D. [facility])   

## 2020-05-08 ENCOUNTER — Ambulatory Visit: Payer: Medicaid Other | Admitting: Medical

## 2020-05-21 ENCOUNTER — Ambulatory Visit: Payer: Medicaid Other

## 2020-05-29 ENCOUNTER — Ambulatory Visit (INDEPENDENT_AMBULATORY_CARE_PROVIDER_SITE_OTHER): Payer: BC Managed Care – PPO

## 2020-05-29 ENCOUNTER — Other Ambulatory Visit: Payer: Self-pay

## 2020-05-29 VITALS — BP 112/74 | HR 88 | Wt 231.7 lb

## 2020-05-29 DIAGNOSIS — Z3042 Encounter for surveillance of injectable contraceptive: Secondary | ICD-10-CM | POA: Diagnosis not present

## 2020-05-29 MED ORDER — MEDROXYPROGESTERONE ACETATE 150 MG/ML IM SUSP
150.0000 mg | Freq: Once | INTRAMUSCULAR | Status: AC
  Administered 2020-05-29: 150 mg via INTRAMUSCULAR

## 2020-05-29 NOTE — Progress Notes (Signed)
Agree with A & P. 

## 2020-05-29 NOTE — Progress Notes (Signed)
Gerri Lins here for Depo-Provera Injection. Injection administered without complication. Patient will return in 3 months for next injection between March 31 and April 14. Next annual visit due .   Ralene Bathe, RN 05/29/2020  8:46 AM

## 2020-08-04 ENCOUNTER — Ambulatory Visit: Payer: Medicaid Other | Admitting: Nurse Practitioner

## 2020-08-19 ENCOUNTER — Encounter: Payer: Self-pay | Admitting: Family Medicine

## 2020-08-19 ENCOUNTER — Other Ambulatory Visit (HOSPITAL_COMMUNITY)
Admission: RE | Admit: 2020-08-19 | Discharge: 2020-08-19 | Disposition: A | Discharge status: Home / Self Care | Payer: BC Managed Care – PPO | Source: Ambulatory Visit | Attending: Medical | Admitting: Medical

## 2020-08-19 ENCOUNTER — Ambulatory Visit (INDEPENDENT_AMBULATORY_CARE_PROVIDER_SITE_OTHER): Payer: BC Managed Care – PPO | Admitting: Family Medicine

## 2020-08-19 ENCOUNTER — Other Ambulatory Visit: Payer: Self-pay

## 2020-08-19 VITALS — BP 124/85 | HR 85 | Ht 67.0 in | Wt 234.8 lb

## 2020-08-19 DIAGNOSIS — Z113 Encounter for screening for infections with a predominantly sexual mode of transmission: Secondary | ICD-10-CM | POA: Insufficient documentation

## 2020-08-19 DIAGNOSIS — Z01419 Encounter for gynecological examination (general) (routine) without abnormal findings: Secondary | ICD-10-CM

## 2020-08-19 DIAGNOSIS — Z124 Encounter for screening for malignant neoplasm of cervix: Secondary | ICD-10-CM

## 2020-08-19 DIAGNOSIS — Z3042 Encounter for surveillance of injectable contraceptive: Secondary | ICD-10-CM

## 2020-08-19 DIAGNOSIS — Z131 Encounter for screening for diabetes mellitus: Secondary | ICD-10-CM

## 2020-08-19 MED ORDER — MEDROXYPROGESTERONE ACETATE 150 MG/ML IM SUSP
150.0000 mg | Freq: Once | INTRAMUSCULAR | Status: AC
  Administered 2020-08-19: 150 mg via INTRAMUSCULAR

## 2020-08-19 NOTE — Assessment & Plan Note (Signed)
Cancer screening: - Pap: collected today - Mammogram: not yet due - Colonoscopy: not due until 43  Contraception: Happy with Depo, given dose today  Mood: stable  Vaccinations: - Flu: out of season - COVID: counseled, she will consider - PPSV23: not indicated - Other:   Metabolic: - DM: hx of GDM and mother with DM2, check A1c today - Lipids: not indicated  ID: - STI: screening sent per patient request

## 2020-08-19 NOTE — Progress Notes (Signed)
GYNECOLOGY OFFICE VISIT NOTE  History:   Cookie Pore is a 30 y.o. G2P1011 here today for annual wellness visit and Depo shot.  Doing well No acute concerns Happy with depo Would like STI screening Mood stable Discussed COVID vaccine at length, she will consider Grandmother died of colon cancer at 17, no other family hx of cancer   Past Medical History:  Diagnosis Date  . Abnormal Pap smear    ASC-US, HPV +  . Chlamydia 1 year ago  . Gestational diabetes     No past surgical history on file.  The following portions of the patient's history were reviewed and updated as appropriate: allergies, current medications, past family history, past medical history, past social history, past surgical history and problem list.   Health Maintenance:  Normal pap: 03/27/2018, no HPV testing.  Normal mammogram: n/a.   Review of Systems:  Pertinent items noted in HPI and remainder of comprehensive ROS otherwise negative.  Physical Exam:  BP 124/85   Pulse 85   Ht 5\' 7"  (1.702 m)   Wt 234 lb 12.8 oz (106.5 kg)   BMI 36.77 kg/m  CONSTITUTIONAL: Well-developed, well-nourished female in no acute distress.  HEENT:  Normocephalic, atraumatic. External right and left ear normal. No scleral icterus.  NECK: Normal range of motion, supple, no masses noted on observation SKIN: No rash noted. Not diaphoretic. No erythema. No pallor. MUSCULOSKELETAL: Normal range of motion. No edema noted. NEUROLOGIC: Alert and oriented to person, place, and time. Normal muscle tone coordination.  PSYCHIATRIC: Normal mood and affect. Normal behavior. Normal judgment and thought content. RESPIRATORY: Effort normal, no problems with respiration noted ABDOMEN: No masses noted. No other overt distention noted.   PELVIC: Normal appearing external genitalia; normal appearing vaginal mucosa and cervix.  No abnormal discharge noted.    Labs and Imaging No results found for this or any previous visit (from the past  168 hour(s)). No results found.    Assessment and Plan:   Problem List Items Addressed This Visit      Other   Well woman exam    Cancer screening: - Pap: collected today - Mammogram: not yet due - Colonoscopy: not due until 43  Contraception: Happy with Depo, given dose today  Mood: stable  Vaccinations: - Flu: out of season - COVID: counseled, she will consider - PPSV23: not indicated - Other:   Metabolic: - DM: hx of GDM and mother with DM2, check A1c today - Lipids: not indicated  ID: - STI: screening sent per patient request       Other Visit Diagnoses    Encounter for surveillance of injectable contraceptive    -  Primary   Relevant Medications   medroxyPROGESTERone (DEPO-PROVERA) injection 150 mg (Completed)   Screening for diabetes mellitus       Relevant Orders   HgB A1c   Screening for cervical cancer       Relevant Orders   Cytology - PAP( [facility])   Screening examination for sexually transmitted disease       Relevant Orders   Cytology - PAP( [facility])   HIV antibody (with reflex)   RPR   Hepatitis C Antibody   Hepatitis B Surface AntiGEN        Routine preventative health maintenance measures emphasized. Please refer to After Visit Summary for other counseling recommendations.   Return in 1 year (on 08/19/2021) for Annual Wellness Visit.    Total face-to-face time with patient: 20 minutes.  Over 50% of encounter was spent on counseling and coordination of care.   Venora Maples, MD/[facility] [employer], [facility]

## 2020-08-19 NOTE — Patient Instructions (Signed)
Preventive Care 11-32 Years Old, Female Preventive care refers to lifestyle choices and visits with your health care provider that can promote health and wellness. This includes:  A yearly physical exam. This is also called an annual wellness visit.  Regular dental and eye exams.  Immunizations.  Screening for certain conditions.  Healthy lifestyle choices, such as: ? Eating a healthy diet. ? Getting regular exercise. ? Not using drugs or products that contain nicotine and tobacco. ? Limiting alcohol use. What can I expect for my preventive care visit? Physical exam Your health care provider may check your:  Height and weight. These may be used to calculate your BMI (body mass index). BMI is a measurement that tells if you are at a healthy weight.  Heart rate and blood pressure.  Body temperature.  Skin for abnormal spots. Counseling Your health care provider may ask you questions about your:  Past medical problems.  Family's medical history.  Alcohol, tobacco, and drug use.  Emotional well-being.  Home life and relationship well-being.  Sexual activity.  Diet, exercise, and sleep habits.  Work and work Statistician.  Access to firearms.  Method of birth control.  Menstrual cycle.  Pregnancy history. What immunizations do I need? Vaccines are usually given at various ages, according to a schedule. Your health care provider will recommend vaccines for you based on your age, medical history, and lifestyle or other factors, such as travel or where you work.   What tests do I need? Blood tests  Lipid and cholesterol levels. These may be checked every 5 years starting at age 64.  Hepatitis C test.  Hepatitis B test. Screening  Diabetes screening. This is done by checking your blood sugar (glucose) after you have not eaten for a while (fasting).  STD (sexually transmitted disease) testing, if you are at risk.  BRCA-related cancer screening. This may  be done if you have a family history of breast, ovarian, tubal, or peritoneal cancers.  Pelvic exam and Pap test. This may be done every 3 years starting at age 61. Starting at age 82, this may be done every 5 years if you have a Pap test in combination with an HPV test. Talk with your health care provider about your test results, treatment options, and if necessary, the need for more tests.   Follow these instructions at home: Eating and drinking  Eat a healthy diet that includes fresh fruits and vegetables, whole grains, lean protein, and low-fat dairy products.  Take vitamin and mineral supplements as recommended by your health care provider.  Do not drink alcohol if: ? Your health care provider tells you not to drink. ? You are pregnant, may be pregnant, or are planning to become pregnant.  If you drink alcohol: ? Limit how much you have to 0-1 drink a day. ? Be aware of how much alcohol is in your drink. In the [place] one drink equals one 12 oz bottle of beer (355 mL), one 5 oz glass of wine (148 mL), or one 1 oz glass of hard liquor (44 mL).   Lifestyle  Take daily care of your teeth and gums. Brush your teeth every morning and night with fluoride toothpaste. Floss one time each day.  Stay active. Exercise for at least 30 minutes 5 or more days each week.  Do not use any products that contain nicotine or tobacco, such as cigarettes, e-cigarettes, and chewing tobacco. If you need help quitting, ask your health care provider.  Do  not use drugs.  If you are sexually active, practice safe sex. Use a condom or other form of protection to prevent STIs (sexually transmitted infections).  If you do not wish to become pregnant, use a form of birth control. If you plan to become pregnant, see your health care provider for a prepregnancy visit.  Find healthy ways to cope with stress, such as: ? Meditation, yoga, or listening to music. ? Journaling. ? Talking to a trusted  person. ? Spending time with friends and family. Safety  Always wear your seat belt while driving or riding in a vehicle.  Do not drive: ? If you have been drinking alcohol. Do not ride with someone who has been drinking. ? When you are tired or distracted. ? While texting.  Wear a helmet and other protective equipment during sports activities.  If you have firearms in your house, make sure you follow all gun safety procedures.  Seek help if you have been physically or sexually abused. What's next?  Go to your health care provider once a year for an annual wellness visit.  Ask your health care provider how often you should have your eyes and teeth checked.  Stay up to date on all vaccines. This information is not intended to replace advice given to you by your health care provider. Make sure you discuss any questions you have with your health care provider. Document Revised: 12/30/2019 Document Reviewed: 01/12/2018 Elsevier Patient Education  2021 [employer].

## 2020-08-20 LAB — HIV ANTIBODY (ROUTINE TESTING W REFLEX): HIV Screen 4th Generation wRfx: NONREACTIVE

## 2020-08-20 LAB — HEMOGLOBIN A1C
Est. average glucose Bld gHb Est-mCnc: 154 mg/dL
Hgb A1c MFr Bld: 7 % — ABNORMAL HIGH (ref 4.8–5.6)

## 2020-08-20 LAB — RPR: RPR Ser Ql: NONREACTIVE

## 2020-08-20 LAB — HEPATITIS B SURFACE ANTIGEN: Hepatitis B Surface Ag: NEGATIVE

## 2020-08-20 LAB — HEPATITIS C ANTIBODY: Hep C Virus Ab: <0.1 s/co ratio (ref 0.0–0.9)

## 2020-08-25 LAB — CYTOLOGY - PAP
Chlamydia: NEGATIVE
Comment: NEGATIVE
Comment: NEGATIVE
Comment: NEGATIVE
Comment: NEGATIVE
Comment: NORMAL
Diagnosis: UNDETERMINED — AB
HPV 16: NEGATIVE
HPV 18 / 45: NEGATIVE
High risk HPV: POSITIVE — AB
Neisseria Gonorrhea: NEGATIVE
Trichomonas: NEGATIVE

## 2020-08-27 ENCOUNTER — Telehealth: Payer: Self-pay | Admitting: Family Medicine

## 2020-08-27 NOTE — Telephone Encounter (Signed)
Phone voicemail full, sent pt mychart msg for procedure appt

## 2020-09-11 ENCOUNTER — Ambulatory Visit: Payer: BC Managed Care – PPO | Admitting: Obstetrics & Gynecology

## 2020-09-29 ENCOUNTER — Ambulatory Visit: Payer: BC Managed Care – PPO | Admitting: Obstetrics and Gynecology

## 2020-11-04 ENCOUNTER — Ambulatory Visit: Payer: BC Managed Care – PPO

## 2020-12-29 ENCOUNTER — Other Ambulatory Visit: Payer: Self-pay

## 2020-12-29 ENCOUNTER — Ambulatory Visit (INDEPENDENT_AMBULATORY_CARE_PROVIDER_SITE_OTHER): Payer: BC Managed Care – PPO | Admitting: General Practice

## 2020-12-29 VITALS — BP 121/79 | HR 79 | Ht 67.0 in | Wt 240.0 lb

## 2020-12-29 DIAGNOSIS — Z3202 Encounter for pregnancy test, result negative: Secondary | ICD-10-CM

## 2020-12-29 DIAGNOSIS — Z3042 Encounter for surveillance of injectable contraceptive: Secondary | ICD-10-CM

## 2020-12-29 LAB — POCT PREGNANCY, URINE: Preg Test, Ur: NEGATIVE

## 2020-12-29 MED ORDER — MEDROXYPROGESTERONE ACETATE 150 MG/ML IM SUSP
150.0000 mg | Freq: Once | INTRAMUSCULAR | Status: AC
  Administered 2020-12-29: 150 mg via INTRAMUSCULAR

## 2020-12-29 NOTE — Progress Notes (Signed)
Alexandria Ross here for Depo-Provera Injection. Patient's last injection was 08/27/20. She reports LMP around 7/10 with unprotected intercourse around then as well, but not since then. Negative UPT obtained. Injection administered without complication. Advised she use condoms for the next week as backup protection. Patient will return in 3 months for next injection between Oct 31 and Nov 14. Next annual visit due April 2023.   Marylynn Pearson, RN 12/29/2020  10:20 AM

## 2020-12-30 ENCOUNTER — Ambulatory Visit: Payer: BC Managed Care – PPO

## 2020-12-30 NOTE — Progress Notes (Signed)
Patient seen and assessed by nursing staff.  Agree with documentation and plan.  

## 2021-03-23 ENCOUNTER — Other Ambulatory Visit: Payer: Self-pay

## 2021-03-23 ENCOUNTER — Ambulatory Visit (INDEPENDENT_AMBULATORY_CARE_PROVIDER_SITE_OTHER): Payer: BC Managed Care – PPO | Admitting: *Deleted

## 2021-03-23 VITALS — BP 125/85 | HR 85 | Ht 67.0 in | Wt 238.0 lb

## 2021-03-23 DIAGNOSIS — Z3042 Encounter for surveillance of injectable contraceptive: Secondary | ICD-10-CM

## 2021-03-23 MED ORDER — MEDROXYPROGESTERONE ACETATE 150 MG/ML IM SUSP
150.0000 mg | Freq: Once | INTRAMUSCULAR | Status: AC
  Administered 2021-03-23: 150 mg via INTRAMUSCULAR

## 2021-03-23 NOTE — Progress Notes (Signed)
Alexandria Ross here for Depo-Provera  Injection.  Injection administered without complication. Patient will return in 3 months for next injection. Last depo-provera 12/29/20. Last annual 08/19/20. Last pap 08/19/20.  [name].RN 03/23/2021  10:27 AM

## 2021-06-08 ENCOUNTER — Other Ambulatory Visit: Payer: Self-pay

## 2021-06-08 ENCOUNTER — Ambulatory Visit (INDEPENDENT_AMBULATORY_CARE_PROVIDER_SITE_OTHER): Payer: BC Managed Care – PPO | Admitting: *Deleted

## 2021-06-08 VITALS — BP 131/84 | HR 82 | Ht 67.0 in | Wt 246.0 lb

## 2021-06-08 DIAGNOSIS — Z3042 Encounter for surveillance of injectable contraceptive: Secondary | ICD-10-CM

## 2021-06-08 MED ORDER — MEDROXYPROGESTERONE ACETATE 150 MG/ML IM SUSP
150.0000 mg | Freq: Once | INTRAMUSCULAR | Status: AC
  Administered 2021-06-08: 150 mg via INTRAMUSCULAR

## 2021-06-08 NOTE — Progress Notes (Signed)
Heide Spark here for Depo-Provera  Injection.  Injection administered without complication. Patient will return in 3 months for next injection. Due 08/24/21- 09/07/21. Last annual was 4/ 5/22. Advised will need annual with next depo-provera. She will make appointment at check out. Last pap was 08/19/20.   Jurell Basista,RN 06/08/2021  10:29 AM

## 2021-06-09 NOTE — Progress Notes (Signed)
Patient was assessed and managed by nursing staff during this encounter. I have reviewed the chart and agree with the documentation and plan. I have also made any necessary editorial changes. ° °Clayton Bosserman, MD °06/09/2021 9:52 AM  ° °

## 2021-06-15 ENCOUNTER — Emergency Department (INDEPENDENT_AMBULATORY_CARE_PROVIDER_SITE_OTHER)
Admission: EM | Admit: 2021-06-15 | Discharge: 2021-06-15 | Disposition: A | Discharge status: Home / Self Care | Payer: BC Managed Care – PPO | Source: Home / Self Care | Attending: Family Medicine | Admitting: Family Medicine

## 2021-06-15 ENCOUNTER — Emergency Department (INDEPENDENT_AMBULATORY_CARE_PROVIDER_SITE_OTHER): Payer: BC Managed Care – PPO

## 2021-06-15 ENCOUNTER — Other Ambulatory Visit: Payer: Self-pay

## 2021-06-15 DIAGNOSIS — S79922A Unspecified injury of left thigh, initial encounter: Secondary | ICD-10-CM

## 2021-06-15 DIAGNOSIS — M25552 Pain in left hip: Secondary | ICD-10-CM

## 2021-06-15 DIAGNOSIS — M25522 Pain in left elbow: Secondary | ICD-10-CM | POA: Diagnosis not present

## 2021-06-15 DIAGNOSIS — M542 Cervicalgia: Secondary | ICD-10-CM

## 2021-06-15 DIAGNOSIS — S5002XA Contusion of left elbow, initial encounter: Secondary | ICD-10-CM

## 2021-06-15 MED ORDER — NAPROXEN SODIUM 550 MG PO TABS: 550.0000 mg | ORAL_TABLET | Freq: Two times a day (BID) | ORAL | 0 refills | Status: DC

## 2021-06-15 MED ORDER — TRAMADOL-ACETAMINOPHEN 37.5-325 MG PO TABS: 1.0000 | ORAL_TABLET | Freq: Four times a day (QID) | ORAL | 0 refills | Status: DC | PRN

## 2021-06-15 MED ORDER — CYCLOBENZAPRINE HCL 5 MG PO TABS: 5.0000 mg | ORAL_TABLET | Freq: Three times a day (TID) | ORAL | 0 refills | Status: DC | PRN
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
## 2021-06-15 NOTE — Discharge Instructions (Signed)
Activity as tolerated. Ice to painful areas for 20 minutes every couple of hours Take Anaprox 2 times a day with food.  This is an anti-inflammatory pain medication Take cyclobenzaprine as needed as muscle relaxer.  This is helpful at bedtime I have prescribed Ultracet to take as needed for pain.  Do not drive on Ultracet See sports medicine if you fail to improve by the end of the week
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
## 2021-11-09 ENCOUNTER — Ambulatory Visit: Payer: BC Managed Care – PPO | Admitting: Obstetrics and Gynecology

## 2022-02-11 ENCOUNTER — Ambulatory Visit (INDEPENDENT_AMBULATORY_CARE_PROVIDER_SITE_OTHER): Payer: BC Managed Care – PPO | Admitting: General Practice

## 2022-02-11 DIAGNOSIS — Z3201 Encounter for pregnancy test, result positive: Secondary | ICD-10-CM

## 2022-02-11 LAB — POCT PREGNANCY, URINE: Preg Test, Ur: POSITIVE — AB

## 2022-02-11 MED ORDER — PROMETHAZINE HCL 25 MG PO TABS: 25.0000 mg | ORAL_TABLET | Freq: Four times a day (QID) | ORAL | 0 refills | Status: DC | PRN

## 2022-02-11 MED ORDER — PREPLUS 27-1 MG PO TABS: 1.0000 | ORAL_TABLET | Freq: Every day | ORAL | 11 refills | Status: DC

## 2022-02-11 NOTE — Progress Notes (Signed)
Patient came by office today and dropped off urine sample for UPT. UPT +  Called patient.  First positive home test was last week. LMP 12/17/21 EDD EDD 09/23/22 8 weeks. She is not taking any meds/vitamins. Phenergan & PNV Rx sent to pharmacy per patient request. Patient will call [facility] office to initiate care since she lives in [facility].   Koren Bound RN BSN 02/11/22

## 2022-04-28 DIAGNOSIS — O26892 Other specified pregnancy related conditions, second trimester: Secondary | ICD-10-CM | POA: Diagnosis not present

## 2022-04-28 DIAGNOSIS — R1084 Generalized abdominal pain: Secondary | ICD-10-CM | POA: Diagnosis not present

## 2022-04-28 DIAGNOSIS — Z3A18 18 weeks gestation of pregnancy: Secondary | ICD-10-CM | POA: Diagnosis not present

## 2022-06-01 ENCOUNTER — Telehealth (INDEPENDENT_AMBULATORY_CARE_PROVIDER_SITE_OTHER): Payer: BC Managed Care – PPO

## 2022-06-01 DIAGNOSIS — Z3689 Encounter for other specified antenatal screening: Secondary | ICD-10-CM

## 2022-06-01 DIAGNOSIS — Z3480 Encounter for supervision of other normal pregnancy, unspecified trimester: Secondary | ICD-10-CM | POA: Insufficient documentation

## 2022-06-01 NOTE — Progress Notes (Signed)
New OB Intake  I connected withNAME@  on 06/01/22 at  1:15 PM EST by MyChart Video Visit and verified that I am speaking with the correct person using two identifiers. Nurse is located at [facility] and pt is located at home.  I discussed the limitations, risks, security and privacy concerns of performing an evaluation and management service by telephone and the availability of in person appointments. I also discussed with the patient that there may be a patient responsible charge related to this service. The patient expressed understanding and agreed to proceed.  I explained I am completing New OB Intake today. We discussed EDD of 09/23/22 that is based on LMP of 12/17/21. Pt is G3/P1. I reviewed her allergies, medications, Medical/Surgical/OB history, and appropriate screenings. I informed her of [facility] services. [facility] information placed in AVS. Based on history, this is a high risk pregnancy.  Patient Active Problem List   Diagnosis Date Noted   Well woman exam 08/19/2020    Concerns addressed today  Delivery Plans Plans to deliver at [facility]. Patient given information for [facility] Healthy Baby website for more information about Women's and [place]. Patient is not interested in water birth. Offered upcoming OB visit with CNM to discuss further.  MyChart/Babyscripts MyChart access verified. I explained pt will have some visits in office and some virtually. Babyscripts instructions given and order placed. Patient verifies receipt of registration text/e-mail. Account successfully created and app downloaded.  Blood Pressure Cuff/Weight Scale Pt has own BP Cuff, Explained after first prenatal appt pt will check weekly and document in 3. Patient does have weight scale.  Anatomy US Explained first scheduled [place] will be around 19 weeks. Anatomy US scheduled for 07/05/22 at 0830a. Pt notified to arrive at 0815a.  Labs Discussed Johnsie Cancel genetic screening with patient. Would like both  Panorama and Horizon drawn at new OB visit. Routine prenatal labs needed.  COVID Vaccine Patient has not had COVID vaccine.   Is patient a CenteringPregnancy candidate?  Not a Candidate Declined due to  NA Not a candidate due to  EDD:09/23/22 If accepted,    Is patient a Mom+Baby Combined Care candidate?  Not a candidate   If accepted, Mom+Baby staff notified  Social Determinants of Health Food Insecurity: Patient denies food insecurity. WIC Referral: Patient is interested in referral to [facility]  Transportation: Patient denies transportation needs. Childcare: Discussed no children allowed at ultrasound appointments. Offered childcare services; patient declines childcare services at this time.  First visit review I reviewed new OB appt with patient. I explained they will have a provider visit that includes . Explained pt will be seen by Dr. Rip Harbour at first visit; encounter routed to appropriate provider. Explained that patient will be seen by pregnancy navigator following visit with provider.   Bethanne Ginger, CMA 06/01/2022  1:28 PM

## 2022-06-04 ENCOUNTER — Ambulatory Visit (INDEPENDENT_AMBULATORY_CARE_PROVIDER_SITE_OTHER): Payer: BC Managed Care – PPO | Admitting: Obstetrics and Gynecology

## 2022-06-04 ENCOUNTER — Other Ambulatory Visit (HOSPITAL_COMMUNITY)
Admission: RE | Admit: 2022-06-04 | Discharge: 2022-06-04 | Disposition: A | Discharge status: Home / Self Care | Payer: BC Managed Care – PPO | Source: Ambulatory Visit | Attending: Obstetrics and Gynecology | Admitting: Obstetrics and Gynecology

## 2022-06-04 ENCOUNTER — Encounter: Payer: Self-pay | Admitting: Family Medicine

## 2022-06-04 ENCOUNTER — Encounter: Payer: Self-pay | Admitting: Obstetrics and Gynecology

## 2022-06-04 VITALS — BP 124/79 | HR 97 | Wt 252.0 lb

## 2022-06-04 DIAGNOSIS — O09292 Supervision of pregnancy with other poor reproductive or obstetric history, second trimester: Secondary | ICD-10-CM

## 2022-06-04 DIAGNOSIS — O24113 Pre-existing diabetes mellitus, type 2, in pregnancy, third trimester: Secondary | ICD-10-CM | POA: Diagnosis not present

## 2022-06-04 DIAGNOSIS — O0930 Supervision of pregnancy with insufficient antenatal care, unspecified trimester: Secondary | ICD-10-CM | POA: Diagnosis not present

## 2022-06-04 DIAGNOSIS — O0933 Supervision of pregnancy with insufficient antenatal care, third trimester: Secondary | ICD-10-CM | POA: Diagnosis not present

## 2022-06-04 DIAGNOSIS — Z3A24 24 weeks gestation of pregnancy: Secondary | ICD-10-CM | POA: Diagnosis not present

## 2022-06-04 DIAGNOSIS — O133 Gestational [pregnancy-induced] hypertension without significant proteinuria, third trimester: Secondary | ICD-10-CM | POA: Diagnosis not present

## 2022-06-04 DIAGNOSIS — O99213 Obesity complicating pregnancy, third trimester: Secondary | ICD-10-CM | POA: Diagnosis not present

## 2022-06-04 DIAGNOSIS — O09293 Supervision of pregnancy with other poor reproductive or obstetric history, third trimester: Secondary | ICD-10-CM | POA: Diagnosis not present

## 2022-06-04 DIAGNOSIS — Z3A37 37 weeks gestation of pregnancy: Secondary | ICD-10-CM | POA: Diagnosis not present

## 2022-06-04 DIAGNOSIS — Z363 Encounter for antenatal screening for malformations: Secondary | ICD-10-CM | POA: Diagnosis not present

## 2022-06-04 DIAGNOSIS — Z3480 Encounter for supervision of other normal pregnancy, unspecified trimester: Secondary | ICD-10-CM

## 2022-06-04 DIAGNOSIS — Z3009 Encounter for other general counseling and advice on contraception: Secondary | ICD-10-CM | POA: Insufficient documentation

## 2022-06-04 DIAGNOSIS — Z8632 Personal history of gestational diabetes: Secondary | ICD-10-CM

## 2022-06-04 DIAGNOSIS — O24415 Gestational diabetes mellitus in pregnancy, controlled by oral hypoglycemic drugs: Secondary | ICD-10-CM | POA: Diagnosis not present

## 2022-06-04 DIAGNOSIS — O09299 Supervision of pregnancy with other poor reproductive or obstetric history, unspecified trimester: Secondary | ICD-10-CM | POA: Insufficient documentation

## 2022-06-04 DIAGNOSIS — Z3A28 28 weeks gestation of pregnancy: Secondary | ICD-10-CM | POA: Diagnosis not present

## 2022-06-04 HISTORY — DX: Supervision of pregnancy with other poor reproductive or obstetric history, unspecified trimester: O09.299

## 2022-06-04 NOTE — Progress Notes (Signed)
Subjective:  Alexandria Ross is a 32 y.o. G3P1011 at [redacted]w[redacted]d being seen today for her first OB visit.EDD by LMP. H/O TSVD without problems. H/O A1 GDM. She is currently monitored for the following issues for this low-risk pregnancy and has Well woman exam; Supervision of other normal pregnancy, antepartum; History of gestational diabetes in prior pregnancy, currently pregnant; and Unwanted fertility on their problem list.  Patient reports no complaints.  Contractions: Not present. Vag. Bleeding: None.  Movement: Present. Denies leaking of fluid.   The following portions of the patient's history were reviewed and updated as appropriate: allergies, current medications, past family history, past medical history, past social history, past surgical history and problem list. Problem list updated.  Objective:   Vitals:   06/04/22 0932  BP: 124/79  Pulse: 97  Weight: 252 lb (114.3 kg)    Fetal Status: Fetal Heart Rate (bpm): 138   Movement: Present     General:  Alert, oriented and cooperative. Patient is in no acute distress.  Skin: Skin is warm and dry. No rash noted.   Cardiovascular: Normal heart rate noted  Respiratory: Normal respiratory effort, no problems with respiration noted  Abdomen: Soft, gravid, appropriate for gestational age. Pain/Pressure: Present     Pelvic:  Cervical exam performed        Extremities: Normal range of motion.  Edema: None  Mental Status: Normal mood and affect. Normal behavior. Normal judgment and thought content.   Urinalysis:      Assessment and Plan:  Pregnancy: G3P1011 at [redacted]w[redacted]d  1. Supervision of other normal pregnancy, antepartum Prenatal care and labs reviewed with pt Genetic testing reviewed - Culture, OB Urine - Comprehensive metabolic panel - Hemoglobin A1c - CBC/D/Plt+RPR+Rh+ABO+RubIgG... - Protein / creatinine ratio, urine - TSH - Panorama Prenatal Test Full Panel - HORIZON Custom - Cytology - PAP( Terry)  2. History of  gestational diabetes in prior pregnancy, currently pregnant Glucola in 2 weeks  3. Unwanted fertility Considering. Has Medicaid as secondary Sign papers at 28 week visit  Preterm labor symptoms and general obstetric precautions including but not limited to vaginal bleeding, contractions, leaking of fluid and fetal movement were reviewed in detail with the patient. Please refer to After Visit Summary for other counseling recommendations.  Return in about 4 weeks (around 07/02/2022) for OB visit, face to face, any provider.   Chancy Milroy, MD

## 2022-06-04 NOTE — Progress Notes (Signed)
No req. For Panorama/Horizon in office today for Pt to sign.

## 2022-06-04 NOTE — Patient Instructions (Signed)

## 2022-06-05 LAB — CBC/D/PLT+RPR+RH+ABO+RUBIGG...
Antibody Screen: NEGATIVE
Basophils Absolute: 0 10*3/uL (ref 0.0–0.2)
Basos: 0 %
EOS (ABSOLUTE): 0.2 10*3/uL (ref 0.0–0.4)
Eos: 2 %
HCV Ab: NONREACTIVE
HIV Screen 4th Generation wRfx: NONREACTIVE
Hematocrit: 34.7 % (ref 34.0–46.6)
Hemoglobin: 11.2 g/dL (ref 11.1–15.9)
Hepatitis B Surface Ag: NEGATIVE
Immature Grans (Abs): 0 10*3/uL (ref 0.0–0.1)
Immature Granulocytes: 0 %
Lymphocytes Absolute: 1.8 10*3/uL (ref 0.7–3.1)
Lymphs: 20 %
MCH: 27.5 pg (ref 26.6–33.0)
MCHC: 32.3 g/dL (ref 31.5–35.7)
MCV: 85 fL (ref 79–97)
Monocytes Absolute: 0.3 10*3/uL (ref 0.1–0.9)
Monocytes: 4 %
Neutrophils Absolute: 6.4 10*3/uL (ref 1.4–7.0)
Neutrophils: 74 %
Platelets: 320 10*3/uL (ref 150–450)
RBC: 4.08 x10E6/uL (ref 3.77–5.28)
RDW: 12.9 % (ref 11.7–15.4)
RPR Ser Ql: NONREACTIVE
Rh Factor: POSITIVE
Rubella Antibodies, IGG: 1.29 index (ref >0.99)
WBC: 8.7 10*3/uL (ref 3.4–10.8)

## 2022-06-05 LAB — COMPREHENSIVE METABOLIC PANEL
ALT: 6 IU/L (ref 0–32)
AST: 11 IU/L (ref 0–40)
Albumin/Globulin Ratio: 1.4 (ref 1.2–2.2)
Albumin: 3.6 g/dL — ABNORMAL LOW (ref 3.9–4.9)
Alkaline Phosphatase: 72 IU/L (ref 44–121)
BUN/Creatinine Ratio: 11 (ref 9–23)
BUN: 5 mg/dL — ABNORMAL LOW (ref 6–20)
Bilirubin Total: 0.2 mg/dL (ref 0.0–1.2)
CO2: 17 mmol/L — ABNORMAL LOW (ref 20–29)
Calcium: 8.6 mg/dL — ABNORMAL LOW (ref 8.7–10.2)
Chloride: 102 mmol/L (ref 96–106)
Creatinine, Ser: 0.47 mg/dL — ABNORMAL LOW (ref 0.57–1.00)
Globulin, Total: 2.6 g/dL (ref 1.5–4.5)
Glucose: 186 mg/dL — ABNORMAL HIGH (ref 70–99)
Potassium: 3.9 mmol/L (ref 3.5–5.2)
Sodium: 135 mmol/L (ref 134–144)
Total Protein: 6.2 g/dL (ref 6.0–8.5)
eGFR: 129 mL/min/{1.73_m2} (ref >59)

## 2022-06-05 LAB — PROTEIN / CREATININE RATIO, URINE
Creatinine, Urine: 273 mg/dL
Protein, Ur: 24.1 mg/dL
Protein/Creat Ratio: 88 mg/g creat (ref 0–200)

## 2022-06-05 LAB — HCV INTERPRETATION

## 2022-06-05 LAB — HEMOGLOBIN A1C
Est. average glucose Bld gHb Est-mCnc: 148 mg/dL
Hgb A1c MFr Bld: 6.8 % — ABNORMAL HIGH (ref 4.8–5.6)

## 2022-06-05 LAB — TSH: TSH: 2.14 u[IU]/mL (ref 0.450–4.500)

## 2022-06-06 LAB — URINE CULTURE, OB REFLEX

## 2022-06-06 LAB — CULTURE, OB URINE

## 2022-06-11 LAB — PANORAMA PRENATAL TEST FULL PANEL:PANORAMA TEST PLUS 5 ADDITIONAL MICRODELETIONS: FETAL FRACTION: 9.7

## 2022-06-11 LAB — CYTOLOGY - PAP
Chlamydia: NEGATIVE
Comment: NEGATIVE
Comment: NEGATIVE
Comment: NEGATIVE
Comment: NEGATIVE
Comment: NORMAL
HPV 16: NEGATIVE
HPV 18 / 45: NEGATIVE
High risk HPV: POSITIVE — AB
Neisseria Gonorrhea: NEGATIVE

## 2022-06-11 LAB — HORIZON CUSTOM: REPORT SUMMARY: NEGATIVE

## 2022-06-14 ENCOUNTER — Telehealth: Payer: Self-pay

## 2022-06-14 NOTE — Telephone Encounter (Addendum)
-----  Message ----- From: Chancy Milroy, MD Sent: 06/11/2022   9:14 AM EST To: [facility] Clinical [facility]  Please let pt know that her Panorama was negative Let her know that her pap smear was abnormal and she will need a colposcopy at her next OB visit Please add colpo to her next OB visit  Thanks Legrand Como   -----------------------------------  Patient called by [facility] staff x 1. Called pt and results reviewed.

## 2022-06-24 ENCOUNTER — Other Ambulatory Visit: Payer: Self-pay | Admitting: *Deleted

## 2022-06-24 DIAGNOSIS — Z3480 Encounter for supervision of other normal pregnancy, unspecified trimester: Secondary | ICD-10-CM

## 2022-06-30 ENCOUNTER — Encounter: Payer: Self-pay | Admitting: Obstetrics and Gynecology

## 2022-06-30 ENCOUNTER — Encounter: Payer: BC Managed Care – PPO | Admitting: Obstetrics and Gynecology

## 2022-06-30 ENCOUNTER — Other Ambulatory Visit: Payer: BC Managed Care – PPO

## 2022-06-30 NOTE — Progress Notes (Signed)
Patient did not keep her OB and colpo appointment for 06/30/2022.  Durene Romans MD Attending Center for [employer] farm manager)
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
## 2022-07-05 ENCOUNTER — Ambulatory Visit: Payer: BC Managed Care – PPO | Attending: Obstetrics and Gynecology | Admitting: *Deleted

## 2022-07-05 ENCOUNTER — Ambulatory Visit: Payer: BC Managed Care – PPO

## 2022-07-05 ENCOUNTER — Encounter: Payer: Self-pay | Admitting: *Deleted

## 2022-07-05 ENCOUNTER — Other Ambulatory Visit: Payer: Self-pay | Admitting: *Deleted

## 2022-07-05 ENCOUNTER — Ambulatory Visit (HOSPITAL_BASED_OUTPATIENT_CLINIC_OR_DEPARTMENT_OTHER): Payer: BC Managed Care – PPO

## 2022-07-05 VITALS — BP 105/66 | HR 98

## 2022-07-05 DIAGNOSIS — O24113 Pre-existing diabetes mellitus, type 2, in pregnancy, third trimester: Secondary | ICD-10-CM | POA: Insufficient documentation

## 2022-07-05 DIAGNOSIS — Z3A28 28 weeks gestation of pregnancy: Secondary | ICD-10-CM | POA: Diagnosis not present

## 2022-07-05 DIAGNOSIS — O0933 Supervision of pregnancy with insufficient antenatal care, third trimester: Secondary | ICD-10-CM | POA: Diagnosis not present

## 2022-07-05 DIAGNOSIS — O99213 Obesity complicating pregnancy, third trimester: Secondary | ICD-10-CM | POA: Insufficient documentation

## 2022-07-05 DIAGNOSIS — Z3480 Encounter for supervision of other normal pregnancy, unspecified trimester: Secondary | ICD-10-CM | POA: Diagnosis not present

## 2022-07-05 DIAGNOSIS — R7309 Other abnormal glucose: Secondary | ICD-10-CM

## 2022-07-05 DIAGNOSIS — O09293 Supervision of pregnancy with other poor reproductive or obstetric history, third trimester: Secondary | ICD-10-CM | POA: Insufficient documentation

## 2022-07-05 DIAGNOSIS — Z363 Encounter for antenatal screening for malformations: Secondary | ICD-10-CM | POA: Insufficient documentation

## 2022-07-12 ENCOUNTER — Other Ambulatory Visit: Payer: Self-pay | Admitting: *Deleted

## 2022-07-12 DIAGNOSIS — R7309 Other abnormal glucose: Secondary | ICD-10-CM

## 2022-07-12 DIAGNOSIS — O09299 Supervision of pregnancy with other poor reproductive or obstetric history, unspecified trimester: Secondary | ICD-10-CM

## 2022-07-12 DIAGNOSIS — O0933 Supervision of pregnancy with insufficient antenatal care, third trimester: Secondary | ICD-10-CM

## 2022-07-12 DIAGNOSIS — O99213 Obesity complicating pregnancy, third trimester: Secondary | ICD-10-CM

## 2022-07-20 DIAGNOSIS — M5489 Other dorsalgia: Secondary | ICD-10-CM | POA: Diagnosis not present

## 2022-07-22 DIAGNOSIS — O24112 Pre-existing diabetes mellitus, type 2, in pregnancy, second trimester: Secondary | ICD-10-CM | POA: Diagnosis not present

## 2022-07-26 ENCOUNTER — Encounter: Payer: Self-pay | Admitting: Obstetrics and Gynecology

## 2022-08-04 ENCOUNTER — Ambulatory Visit: Payer: BC Managed Care – PPO | Admitting: *Deleted

## 2022-08-04 ENCOUNTER — Ambulatory Visit: Payer: BC Managed Care – PPO | Attending: Obstetrics and Gynecology

## 2022-08-04 VITALS — BP 122/79 | HR 96

## 2022-08-04 DIAGNOSIS — Z3689 Encounter for other specified antenatal screening: Secondary | ICD-10-CM | POA: Insufficient documentation

## 2022-08-04 DIAGNOSIS — E669 Obesity, unspecified: Secondary | ICD-10-CM

## 2022-08-04 DIAGNOSIS — O09293 Supervision of pregnancy with other poor reproductive or obstetric history, third trimester: Secondary | ICD-10-CM | POA: Insufficient documentation

## 2022-08-04 DIAGNOSIS — O0933 Supervision of pregnancy with insufficient antenatal care, third trimester: Secondary | ICD-10-CM | POA: Diagnosis not present

## 2022-08-04 DIAGNOSIS — Z3A32 32 weeks gestation of pregnancy: Secondary | ICD-10-CM

## 2022-08-04 DIAGNOSIS — O99213 Obesity complicating pregnancy, third trimester: Secondary | ICD-10-CM | POA: Diagnosis not present

## 2022-08-04 DIAGNOSIS — O24113 Pre-existing diabetes mellitus, type 2, in pregnancy, third trimester: Secondary | ICD-10-CM

## 2022-08-04 DIAGNOSIS — R7309 Other abnormal glucose: Secondary | ICD-10-CM | POA: Insufficient documentation

## 2022-08-04 DIAGNOSIS — O09299 Supervision of pregnancy with other poor reproductive or obstetric history, unspecified trimester: Secondary | ICD-10-CM | POA: Diagnosis not present

## 2022-08-04 DIAGNOSIS — Z8632 Personal history of gestational diabetes: Secondary | ICD-10-CM | POA: Insufficient documentation

## 2022-08-04 DIAGNOSIS — E119 Type 2 diabetes mellitus without complications: Secondary | ICD-10-CM | POA: Diagnosis not present

## 2022-08-06 ENCOUNTER — Telehealth (INDEPENDENT_AMBULATORY_CARE_PROVIDER_SITE_OTHER): Payer: BC Managed Care – PPO | Admitting: Obstetrics and Gynecology

## 2022-08-06 ENCOUNTER — Encounter: Payer: Self-pay | Admitting: Obstetrics and Gynecology

## 2022-08-06 VITALS — Ht 67.0 in | Wt 265.0 lb

## 2022-08-06 DIAGNOSIS — Z3480 Encounter for supervision of other normal pregnancy, unspecified trimester: Secondary | ICD-10-CM

## 2022-08-06 DIAGNOSIS — O09293 Supervision of pregnancy with other poor reproductive or obstetric history, third trimester: Secondary | ICD-10-CM

## 2022-08-06 DIAGNOSIS — Z3A33 33 weeks gestation of pregnancy: Secondary | ICD-10-CM

## 2022-08-06 DIAGNOSIS — Z3009 Encounter for other general counseling and advice on contraception: Secondary | ICD-10-CM

## 2022-08-06 DIAGNOSIS — O09299 Supervision of pregnancy with other poor reproductive or obstetric history, unspecified trimester: Secondary | ICD-10-CM

## 2022-08-06 NOTE — Progress Notes (Signed)
OBSTETRICS PRENATAL VIRTUAL VISIT ENCOUNTER NOTE  Provider location: [facility] [place] [facility] [place] [facility]   Patient location: Home  I connected with Alexandria Ross on 08/06/22 at 11:15 AM EDT by MyChart Video Encounter and verified that I am speaking with the correct person using two identifiers. I discussed the limitations, risks, security and privacy concerns of performing an evaluation and management service virtually and the availability of in person appointments. I also discussed with the patient that there may be a patient responsible charge related to this service. The patient expressed understanding and agreed to proceed. Subjective:  Alexandria Ross is a 32 y.o. G3P1011 at [redacted]w[redacted]d being seen today for ongoing prenatal care.  She is currently monitored for the following issues for this low-risk pregnancy and has Well woman exam; Supervision of other normal pregnancy, antepartum; History of gestational diabetes in prior pregnancy, currently pregnant; and Unwanted fertility on their problem list.  Patient reports no complaints.  Contractions: Not present. Vag. Bleeding: None.  Movement: Present. Denies any leaking of fluid.   The following portions of the patient's history were reviewed and updated as appropriate: allergies, current medications, past family history, past medical history, past social history, past surgical history and problem list.   Objective:   Vitals:   08/06/22 1111  Weight: 265 lb (120.2 kg)  Height: 5\' 7"  (1.702 m)    Fetal Status:     Movement: Present     General:  Alert, oriented and cooperative. Patient is in no acute distress.  Respiratory: Normal respiratory effort, no problems with respiration noted  Mental Status: Normal mood and affect. Normal behavior. Normal judgment and thought content.  Rest of physical exam deferred due to type of encounter  Imaging: [place] MFM OB FOLLOW UP  Result Date:  08/04/2022 ----------------------------------------------------------------------  OBSTETRICS REPORT                       (Signed Final 08/04/2022 11:13 am) ---------------------------------------------------------------------- Patient Info  ID #:       [id number]                          D.O.B.:  06/25/1990 (31 yrs)  Name:       Alexandria Ross                 Visit Date: 08/04/2022 10:26 am ---------------------------------------------------------------------- Performed By  Attending:        Valeda Malm DO       Secondary Phy.:   Chancy Milroy                                                             MD  Performed By:     Jeanene Erb BS,      Address:          [place]  [place], [place]  Referred By:      [place]          [facility]         [facility]                                F[facility] Care at                                                             [facility] for                                                             Women  Ref. Address:     [address]                    [place], [place] ---------------------------------------------------------------------- Orders  #  Description                           Code        Ordered By  1  [place] MFM OB FOLLOW UP                   [id number]    RAVI SHANKAR  2  [place] MFM FETAL BPP WO NON               [id number]    RAVI [facility]     STRESS ----------------------------------------------------------------------  #  Order #                     Accession #                Episode #  1  [id number]                   [id number]                 [id number]  2  [id number]                   [id number]                 [id number] ---------------------------------------------------------------------- Indications  Diabetes - Pregestational,3rd trimester (AiC   O24.313   0000000)  Obesity complicating pregnancy, third          O99.213  trimester (BMI 38)  Late to prenatal care, third trimester  O09.33  Poor obstetric history: Previous gestational   O09.299  diabetes  LR NIPS  [redacted] weeks gestation of pregnancy                Z3A.32 ---------------------------------------------------------------------- Vital Signs  BP:          122/79 ---------------------------------------------------------------------- Fetal Evaluation  Num Of Fetuses:         1  Fetal Heart Rate(bpm):  140  Cardiac Activity:       Observed  Presentation:           Cephalic  Placenta:               Anterior  P. Cord Insertion:      Previously visualized  Amniotic Fluid  AFI FV:      Within normal limits  AFI Sum(cm)     %Tile       Largest Pocket(cm)  22.78           88          6.82  RUQ(cm)       RLQ(cm)       LUQ(cm)        LLQ(cm)  6.82          5.65          6.56           3.75 ---------------------------------------------------------------------- Biophysical Evaluation  Amniotic F.V:   Pocket => 2 cm             F. Tone:        Observed  F. Movement:    Observed                   Score:          8/8  F. Breathing:   Observed ---------------------------------------------------------------------- Biometry  BPD:      81.6  mm     G. Age:  32w 6d         41  %    CI:        73.76   %    70 - 86                                                          FL/HC:      20.6   %    19.9 - 21.5  HC:      301.8  mm     G. Age:  33w 4d         29  %    HC/AC:      0.94        0.96 - 1.11  AC:      322.2  mm     G. Age:  36w 1d       > 99  %    FL/BPD:     76.1   %    71 - 87  FL:       62.1  mm     G. Age:  32w 1d         21  %    FL/AC:      19.3   %    20 - 24  Est. FW:    2440  gm      5 lb  6 oz     87  % ---------------------------------------------------------------------- OB History  Blood Type:   O+  Gravidity:    3         Term:   1  TOP:          1        Living:  1  ---------------------------------------------------------------------- Gestational Age  LMP:           32w 6d        Date:  12/17/21                   EDD:   09/23/22  U/S Today:     33w 5d                                        EDD:   09/17/22  Best:          32w 6d     Det. By:  LMP  (12/17/21)          EDD:   09/23/22 ---------------------------------------------------------------------- Anatomy  Cranium:               Appears normal         LVOT:                   Not well visualized  Cavum:                 Previously seen        Aortic Arch:            Not well visualized  Ventricles:            Appears normal         Ductal Arch:            Not well visualized  Choroid Plexus:        Previously seen        Diaphragm:              Appears normal  Cerebellum:            Previously seen        Stomach:                Appears normal, left                                                                        sided  Posterior Fossa:       Previously seen        Abdomen:                Appears normal  Nuchal Fold:           Not applicable (Q000111Q    Abdominal Wall:         Not well visualized                         wks GA)  Face:                  Orbits and profile     Cord Vessels:  Previously seen                         previously seen  Lips:                  Previously seen        Kidneys:                Appear normal  Palate:                Not well visualized    Bladder:                Appears normal  Thoracic:              Appears normal         Spine:                  Previously seen  Heart:                 Appears normal         Upper Extremities:      Visualized prev                         (4CH, axis, and                         situs)  RVOT:                  Not well visualized    Lower Extremities:      Visualized prev  Other:  Gender not well visualized. Hands and feet present, but suboptimal          visualization. Technicallly difficult due to advanced GA, fetal position,          and maternal  habitus. ---------------------------------------------------------------------- Cervix Uterus Adnexa  Cervix  Not visualized (advanced GA >24wks) ---------------------------------------------------------------------- Comments  The patient is here for a follow-up BPP and growth ultrasound  at 32w 6d for type 2 diabetes and obesity. EDD: 09/23/2022  dated by LMP  (12/17/21). She has no concerns today and  reports good glycemic control.  Sonographic findings  Single intrauterine pregnancy.  Fetal cardiac activity: Observed.  Presentation: Cephalic.  Interval fetal anatomy appears normal.  Fetal biometry shows the estimated fetal weight at the 87  percentile.  Amniotic fluid volume: Within normal limits. AFI: 22.78 cm.  MVP: 6.82 cm.  Placenta: Anterior.  BPP: 8/8.  Recommendations  1. BBPs weekly until delivery  2. Growth ultrasounds every 4 weeks until delivery  3. Delivery around [redacted] weeks gestation or sooner if indicated ----------------------------------------------------------------------                  Valeda Malm, DO Electronically Signed Final Report   08/04/2022 11:13 am ----------------------------------------------------------------------  [place] MFM FETAL BPP WO NON STRESS  Result Date: 08/04/2022 ----------------------------------------------------------------------  OBSTETRICS REPORT                       (Signed Final 08/04/2022 11:13 am) ---------------------------------------------------------------------- Patient Info  ID #:       [id number]                          D.O.B.:  [birth year] (31 yrs)  Name:       Alexandria Ross  Visit Date: 08/04/2022 10:26 am ---------------------------------------------------------------------- Performed By  Attending:        Valeda Malm DO       Secondary Phy.:   Chancy Milroy                                                             MD  Performed By:     Jeanene Erb BS,      Address:          [place], [place]  Referred By:      [place]          Location[facility]         [facility]                                Fetal Care at                                                             [facility] for                                                             Women  Ref. Address:     [address]                    [place], [place] ---------------------------------------------------------------------- Orders  #  Description                           Code        Ordered By  1  [place] MFM OB FOLLOW UP                   [id number]    RAVI  [facility]  2  [place] MFM FETAL BPP WO NON               [id number]    RAVI [facility]     STRESS ----------------------------------------------------------------------  #  Order #                     Accession #                Episode #  1  [id number]                   [id number]                 [id number]  2  [id number]                   [id number]                 [id number] ---------------------------------------------------------------------- Indications  Diabetes - Pregestational,3rd trimester (AiC   O24.313  0000000)  Obesity complicating pregnancy, third          O99.213  trimester (BMI 38)  Late to prenatal care, third trimester         O09.33  Poor obstetric history: Previous gestational   O09.299  diabetes  LR NIPS  [redacted] weeks gestation of pregnancy                Z3A.32 ---------------------------------------------------------------------- Vital Signs  BP:          122/79 ---------------------------------------------------------------------- Fetal Evaluation  Num Of Fetuses:         1  Fetal Heart Rate(bpm):  140  Cardiac Activity:       Observed  Presentation:           Cephalic  Placenta:               Anterior  P. Cord Insertion:      Previously visualized  Amniotic Fluid  AFI FV:      Within normal limits  AFI Sum(cm)      %Tile       Largest Pocket(cm)  22.78           88          6.82  RUQ(cm)       RLQ(cm)       LUQ(cm)        LLQ(cm)  6.82          5.65          6.56           3.75 ---------------------------------------------------------------------- Biophysical Evaluation  Amniotic F.V:   Pocket => 2 cm             F. Tone:        Observed  F. Movement:    Observed                   Score:          8/8  F. Breathing:   Observed ---------------------------------------------------------------------- Biometry  BPD:      81.6  mm     G. Age:  32w 6d         41  %    CI:        73.76   %    70 - 86  FL/HC:      20.6   %    19.9 - 21.5  HC:      301.8  mm     G. Age:  33w 4d         29  %    HC/AC:      0.94        0.96 - 1.11  AC:      322.2  mm     G. Age:  36w 1d       > 99  %    FL/BPD:     76.1   %    71 - 87  FL:       62.1  mm     G. Age:  32w 1d         21  %    FL/AC:      19.3   %    20 - 24  Est. FW:    2440  gm      5 lb 6 oz     87  % ---------------------------------------------------------------------- OB History  Blood Type:   O+  Gravidity:    3         Term:   1  TOP:          1        Living:  1 ---------------------------------------------------------------------- Gestational Age  LMP:           32w 6d        Date:  12/17/21                   EDD:   09/23/22  U/S Today:     33w 5d                                        EDD:   09/17/22  Best:          32w 6d     Det. By:  LMP  (12/17/21)          EDD:   09/23/22 ---------------------------------------------------------------------- Anatomy  Cranium:               Appears normal         LVOT:                   Not well visualized  Cavum:                 Previously seen        Aortic Arch:            Not well visualized  Ventricles:            Appears normal         Ductal Arch:            Not well visualized  Choroid Plexus:        Previously seen        Diaphragm:              Appears normal  Cerebellum:             Previously seen        Stomach:                Appears normal, left  sided  Posterior Fossa:       Previously seen        Abdomen:                Appears normal  Nuchal Fold:           Not applicable (Q000111Q    Abdominal Wall:         Not well visualized                         wks GA)  Face:                  Orbits and profile     Cord Vessels:           Previously seen                         previously seen  Lips:                  Previously seen        Kidneys:                Appear normal  Palate:                Not well visualized    Bladder:                Appears normal  Thoracic:              Appears normal         Spine:                  Previously seen  Heart:                 Appears normal         Upper Extremities:      Visualized prev                         (4CH, axis, and                         situs)  RVOT:                  Not well visualized    Lower Extremities:      Visualized prev  Other:  Gender not well visualized. Hands and feet present, but suboptimal          visualization. Technicallly difficult due to advanced GA, fetal position,          and maternal habitus. ---------------------------------------------------------------------- Cervix Uterus Adnexa  Cervix  Not visualized (advanced GA >24wks) ---------------------------------------------------------------------- Comments  The patient is here for a follow-up BPP and growth ultrasound  at 32w 6d for type 2 diabetes and obesity. EDD: 09/23/2022  dated by LMP  (12/17/21). She has no concerns today and  reports good glycemic control.  Sonographic findings  Single intrauterine pregnancy.  Fetal cardiac activity: Observed.  Presentation: Cephalic.  Interval fetal anatomy appears normal.  Fetal biometry shows the estimated fetal weight at the 87  percentile.  Amniotic fluid volume: Within normal limits. AFI: 22.78 cm.  MVP: 6.82 cm.  Placenta: Anterior.  BPP: 8/8.   Recommendations  1. BBPs weekly until delivery  2. Growth ultrasounds every 4 weeks until delivery  3. Delivery around [redacted] weeks gestation or sooner if indicated ----------------------------------------------------------------------  Valeda Malm, DO Electronically Signed Final Report   08/04/2022 11:13 am ----------------------------------------------------------------------   Assessment and Plan:  Pregnancy: [id number] at [redacted]w[redacted]d 1. Supervision of other normal pregnancy, antepartum Stable Pt has not had Glucola yet Will have pt come in next week for 1 hr Glucola only  2. Unwanted fertility Sign BTL papers at next visit  3. History of gestational diabetes in prior pregnancy, currently pregnant For Glucola as noted above  Preterm labor symptoms and general obstetric precautions including but not limited to vaginal bleeding, contractions, leaking of fluid and fetal movement were reviewed in detail with the patient. I discussed the assessment and treatment plan with the patient. The patient was provided an opportunity to ask questions and all were answered. The patient agreed with the plan and demonstrated an understanding of the instructions. The patient was advised to call back or seek an in-person office evaluation/go to [facility] at [facility] for any urgent or concerning symptoms. Please refer to After Visit Summary for other counseling recommendations.   I provided 8 minutes of face-to-face time during this encounter.  Return in about 2 weeks (around 08/20/2022) for OB visit, face to face, MD only.  Future Appointments  Date Time Provider [place]  08/11/2022 10:15 AM [facility] NURSE [facility]  08/11/2022 10:30 AM [facility] US2 [facility]  08/18/2022 10:15 AM [facility] NURSE [facility]  08/18/2022 10:30 AM [facility] US2 [facility]  08/25/2022 10:15 AM [facility] NURSE [facility]  08/25/2022 10:30 AM [facility] US2 [facility]  09/01/2022 10:15 AM [facility] NURSE  [facility]  09/01/2022 10:30 AM [facility] US2 [facility] [place]    Chancy Milroy, [place] for [employer], [place]

## 2022-08-09 ENCOUNTER — Ambulatory Visit: Payer: BC Managed Care – PPO

## 2022-08-09 ENCOUNTER — Other Ambulatory Visit: Payer: BC Managed Care – PPO

## 2022-08-11 ENCOUNTER — Ambulatory Visit (HOSPITAL_BASED_OUTPATIENT_CLINIC_OR_DEPARTMENT_OTHER): Payer: BC Managed Care – PPO

## 2022-08-11 ENCOUNTER — Ambulatory Visit: Payer: BC Managed Care – PPO | Attending: Obstetrics and Gynecology | Admitting: *Deleted

## 2022-08-11 VITALS — BP 111/72 | HR 100

## 2022-08-11 DIAGNOSIS — O09293 Supervision of pregnancy with other poor reproductive or obstetric history, third trimester: Secondary | ICD-10-CM | POA: Diagnosis not present

## 2022-08-11 DIAGNOSIS — Z3A33 33 weeks gestation of pregnancy: Secondary | ICD-10-CM | POA: Diagnosis not present

## 2022-08-11 DIAGNOSIS — R7309 Other abnormal glucose: Secondary | ICD-10-CM

## 2022-08-11 DIAGNOSIS — O99213 Obesity complicating pregnancy, third trimester: Secondary | ICD-10-CM | POA: Diagnosis not present

## 2022-08-11 DIAGNOSIS — O24113 Pre-existing diabetes mellitus, type 2, in pregnancy, third trimester: Secondary | ICD-10-CM | POA: Insufficient documentation

## 2022-08-11 DIAGNOSIS — O0933 Supervision of pregnancy with insufficient antenatal care, third trimester: Secondary | ICD-10-CM

## 2022-08-11 DIAGNOSIS — O09299 Supervision of pregnancy with other poor reproductive or obstetric history, unspecified trimester: Secondary | ICD-10-CM

## 2022-08-11 DIAGNOSIS — E119 Type 2 diabetes mellitus without complications: Secondary | ICD-10-CM | POA: Diagnosis not present

## 2022-08-11 DIAGNOSIS — E669 Obesity, unspecified: Secondary | ICD-10-CM

## 2022-08-11 DIAGNOSIS — O24313 Unspecified pre-existing diabetes mellitus in pregnancy, third trimester: Secondary | ICD-10-CM | POA: Diagnosis not present

## 2022-08-11 DIAGNOSIS — Z8632 Personal history of gestational diabetes: Secondary | ICD-10-CM

## 2022-08-16 ENCOUNTER — Other Ambulatory Visit: Payer: BC Managed Care – PPO

## 2022-08-16 ENCOUNTER — Ambulatory Visit: Payer: BC Managed Care – PPO

## 2022-08-18 ENCOUNTER — Other Ambulatory Visit: Payer: Self-pay

## 2022-08-18 ENCOUNTER — Ambulatory Visit (HOSPITAL_BASED_OUTPATIENT_CLINIC_OR_DEPARTMENT_OTHER): Payer: BC Managed Care – PPO

## 2022-08-18 ENCOUNTER — Other Ambulatory Visit: Payer: BC Managed Care – PPO

## 2022-08-18 ENCOUNTER — Ambulatory Visit: Payer: BC Managed Care – PPO | Attending: Obstetrics and Gynecology | Admitting: *Deleted

## 2022-08-18 VITALS — BP 105/75 | HR 91

## 2022-08-18 DIAGNOSIS — O0933 Supervision of pregnancy with insufficient antenatal care, third trimester: Secondary | ICD-10-CM

## 2022-08-18 DIAGNOSIS — O99213 Obesity complicating pregnancy, third trimester: Secondary | ICD-10-CM

## 2022-08-18 DIAGNOSIS — O09293 Supervision of pregnancy with other poor reproductive or obstetric history, third trimester: Secondary | ICD-10-CM | POA: Insufficient documentation

## 2022-08-18 DIAGNOSIS — E669 Obesity, unspecified: Secondary | ICD-10-CM

## 2022-08-18 DIAGNOSIS — O24313 Unspecified pre-existing diabetes mellitus in pregnancy, third trimester: Secondary | ICD-10-CM | POA: Diagnosis not present

## 2022-08-18 DIAGNOSIS — Z3A34 34 weeks gestation of pregnancy: Secondary | ICD-10-CM | POA: Insufficient documentation

## 2022-08-18 DIAGNOSIS — Z362 Encounter for other antenatal screening follow-up: Secondary | ICD-10-CM | POA: Diagnosis not present

## 2022-08-18 DIAGNOSIS — Z3480 Encounter for supervision of other normal pregnancy, unspecified trimester: Secondary | ICD-10-CM | POA: Diagnosis not present

## 2022-08-18 DIAGNOSIS — R7309 Other abnormal glucose: Secondary | ICD-10-CM

## 2022-08-18 DIAGNOSIS — O09299 Supervision of pregnancy with other poor reproductive or obstetric history, unspecified trimester: Secondary | ICD-10-CM

## 2022-08-19 ENCOUNTER — Other Ambulatory Visit: Payer: Self-pay

## 2022-08-19 ENCOUNTER — Encounter: Payer: Self-pay | Admitting: Obstetrics and Gynecology

## 2022-08-19 DIAGNOSIS — O24419 Gestational diabetes mellitus in pregnancy, unspecified control: Secondary | ICD-10-CM | POA: Insufficient documentation

## 2022-08-19 DIAGNOSIS — Z3480 Encounter for supervision of other normal pregnancy, unspecified trimester: Secondary | ICD-10-CM

## 2022-08-19 DIAGNOSIS — E119 Type 2 diabetes mellitus without complications: Secondary | ICD-10-CM | POA: Insufficient documentation

## 2022-08-19 LAB — CBC
Hematocrit: 36.9 % (ref 34.0–46.6)
Hemoglobin: 12 g/dL (ref 11.1–15.9)
MCH: 27.6 pg (ref 26.6–33.0)
MCHC: 32.5 g/dL (ref 31.5–35.7)
MCV: 85 fL (ref 79–97)
Platelets: 269 10*3/uL (ref 150–450)
RBC: 4.35 x10E6/uL (ref 3.77–5.28)
RDW: 13.6 % (ref 11.7–15.4)
WBC: 8.2 10*3/uL (ref 3.4–10.8)

## 2022-08-19 LAB — GLUCOSE TOLERANCE, 1 HOUR: Glucose, 1Hr PP: 237 mg/dL — ABNORMAL HIGH (ref 70–199)

## 2022-08-19 LAB — RPR: RPR Ser Ql: NONREACTIVE

## 2022-08-19 LAB — HIV ANTIBODY (ROUTINE TESTING W REFLEX): HIV Screen 4th Generation wRfx: NONREACTIVE

## 2022-08-19 MED ORDER — ACCU-CHEK GUIDE W/DEVICE KIT
1.0000 | PACK | Freq: Every day | 0 refills | Status: DC
Start: 2022-08-19 — End: 2022-09-12

## 2022-08-19 MED ORDER — ACCU-CHEK SOFTCLIX LANCETS MISC
100.0000 | Freq: Four times a day (QID) | 12 refills | Status: DC
Start: 2022-08-19 — End: 2022-09-12

## 2022-08-19 MED ORDER — ACCU-CHEK GUIDE VI STRP
ORAL_STRIP | 12 refills | Status: DC
Start: 2022-08-19 — End: 2022-09-12

## 2022-08-23 ENCOUNTER — Ambulatory Visit (INDEPENDENT_AMBULATORY_CARE_PROVIDER_SITE_OTHER): Payer: BC Managed Care – PPO | Admitting: Obstetrics and Gynecology

## 2022-08-23 ENCOUNTER — Encounter: Payer: Self-pay | Admitting: Family Medicine

## 2022-08-23 ENCOUNTER — Encounter: Payer: Self-pay | Admitting: Obstetrics and Gynecology

## 2022-08-23 ENCOUNTER — Other Ambulatory Visit: Payer: Self-pay

## 2022-08-23 VITALS — BP 126/88 | HR 78 | Wt 257.0 lb

## 2022-08-23 DIAGNOSIS — O24410 Gestational diabetes mellitus in pregnancy, diet controlled: Secondary | ICD-10-CM

## 2022-08-23 DIAGNOSIS — Z3A35 35 weeks gestation of pregnancy: Secondary | ICD-10-CM

## 2022-08-23 DIAGNOSIS — Z3480 Encounter for supervision of other normal pregnancy, unspecified trimester: Secondary | ICD-10-CM

## 2022-08-23 DIAGNOSIS — Z3009 Encounter for other general counseling and advice on contraception: Secondary | ICD-10-CM

## 2022-08-23 DIAGNOSIS — Z0289 Encounter for other administrative examinations: Secondary | ICD-10-CM

## 2022-08-23 MED ORDER — METFORMIN HCL 500 MG PO TABS: 500.0000 mg | ORAL_TABLET | Freq: Two times a day (BID) | ORAL | 5 refills | Status: DC

## 2022-08-23 NOTE — Progress Notes (Signed)
Subjective:  Alexandria Ross is a 32 y.o. G3P1011 at [redacted]w[redacted]d being seen today for ongoing prenatal care.  She is currently monitored for the following issues for this high-risk pregnancy and has Well woman exam; Supervision of other normal pregnancy, antepartum; History of gestational diabetes in prior pregnancy, currently pregnant; Unwanted fertility; and Gestational diabetes on their problem list.  Patient reports  general discomforts of pregnancy .  Contractions: Irritability. Vag. Bleeding: None.  Movement: Present. Denies leaking of fluid.   The following portions of the patient's history were reviewed and updated as appropriate: allergies, current medications, past family history, past medical history, past social history, past surgical history and problem list. Problem list updated.  Objective:   Vitals:   08/23/22 0944 08/23/22 0955 08/23/22 1009  BP: (!) 121/100 (!) 119/92 126/88  Pulse: 89 89 78  Weight: 257 lb (116.6 kg)      Fetal Status: Fetal Heart Rate (bpm): 140   Movement: Present     General:  Alert, oriented and cooperative. Patient is in no acute distress.  Skin: Skin is warm and dry. No rash noted.   Cardiovascular: Normal heart rate noted  Respiratory: Normal respiratory effort, no problems with respiration noted  Abdomen: Soft, gravid, appropriate for gestational age. Pain/Pressure: Present     Pelvic:  Cervical exam deferred        Extremities: Normal range of motion.  Edema: None  Mental Status: Normal mood and affect. Normal behavior. Normal judgment and thought content.   Urinalysis:      Assessment and Plan:  Pregnancy: G3P1011 at [redacted]w[redacted]d  1. Supervision of other normal pregnancy, antepartum Stable  2. Diet controlled gestational diabetes mellitus (GDM) in third trimester CBG's not in goal range DM and pregnancy reviewed with pt Will start Metformin. Indicates reviewed with pt Continue with serial growth scans and antenatal testing as per  MFM Considering IOL at 37-39 weeks as per growth scans and antenatal testing - metFORMIN (GLUCOPHAGE) 500 MG tablet; Take 1 tablet (500 mg total) by mouth 2 (two) times daily.  Dispense: 60 tablet; Refill: 5  3. Unwanted fertility BTL papers signed today  Preterm labor symptoms and general obstetric precautions including but not limited to vaginal bleeding, contractions, leaking of fluid and fetal movement were reviewed in detail with the patient. Please refer to After Visit Summary for other counseling recommendations.  Return in about 1 week (around 08/30/2022) for OB visit, face to face, MD only.   Hermina Staggers, MD

## 2022-08-24 ENCOUNTER — Other Ambulatory Visit: Payer: Self-pay | Admitting: *Deleted

## 2022-08-24 NOTE — Progress Notes (Signed)
3rd TC to discuss GDM and RX supplies and appt with diabetes educators. No answer. Left HIPAA compliant VM with call back number. Pt has been in the office since failed GTT. Pt has not answered calls from diabetes educators to schedule appt. MC msg with education and advised of above.

## 2022-08-25 ENCOUNTER — Ambulatory Visit: Payer: BC Managed Care – PPO | Attending: Obstetrics and Gynecology

## 2022-08-25 ENCOUNTER — Ambulatory Visit: Payer: BC Managed Care – PPO | Admitting: *Deleted

## 2022-08-25 ENCOUNTER — Other Ambulatory Visit: Payer: Self-pay | Admitting: *Deleted

## 2022-08-25 VITALS — BP 114/81 | HR 94

## 2022-08-25 DIAGNOSIS — O09299 Supervision of pregnancy with other poor reproductive or obstetric history, unspecified trimester: Secondary | ICD-10-CM | POA: Diagnosis not present

## 2022-08-25 DIAGNOSIS — O24113 Pre-existing diabetes mellitus, type 2, in pregnancy, third trimester: Secondary | ICD-10-CM | POA: Insufficient documentation

## 2022-08-25 DIAGNOSIS — O0933 Supervision of pregnancy with insufficient antenatal care, third trimester: Secondary | ICD-10-CM | POA: Diagnosis not present

## 2022-08-25 DIAGNOSIS — E669 Obesity, unspecified: Secondary | ICD-10-CM

## 2022-08-25 DIAGNOSIS — Z3A35 35 weeks gestation of pregnancy: Secondary | ICD-10-CM

## 2022-08-25 DIAGNOSIS — O09293 Supervision of pregnancy with other poor reproductive or obstetric history, third trimester: Secondary | ICD-10-CM | POA: Diagnosis not present

## 2022-08-25 DIAGNOSIS — R7309 Other abnormal glucose: Secondary | ICD-10-CM | POA: Diagnosis not present

## 2022-08-25 DIAGNOSIS — Z8632 Personal history of gestational diabetes: Secondary | ICD-10-CM | POA: Diagnosis not present

## 2022-08-25 DIAGNOSIS — O99213 Obesity complicating pregnancy, third trimester: Secondary | ICD-10-CM | POA: Diagnosis not present

## 2022-08-25 DIAGNOSIS — O24419 Gestational diabetes mellitus in pregnancy, unspecified control: Secondary | ICD-10-CM

## 2022-08-25 DIAGNOSIS — O24313 Unspecified pre-existing diabetes mellitus in pregnancy, third trimester: Secondary | ICD-10-CM | POA: Diagnosis not present

## 2022-08-30 ENCOUNTER — Ambulatory Visit: Payer: BC Managed Care – PPO

## 2022-08-30 ENCOUNTER — Encounter: Payer: Self-pay | Admitting: Obstetrics and Gynecology

## 2022-09-01 ENCOUNTER — Ambulatory Visit: Payer: BC Managed Care – PPO | Attending: Obstetrics and Gynecology | Admitting: *Deleted

## 2022-09-01 ENCOUNTER — Telehealth: Payer: Self-pay | Admitting: *Deleted

## 2022-09-01 ENCOUNTER — Encounter: Payer: Self-pay | Admitting: Obstetrics and Gynecology

## 2022-09-01 ENCOUNTER — Ambulatory Visit (HOSPITAL_BASED_OUTPATIENT_CLINIC_OR_DEPARTMENT_OTHER): Payer: BC Managed Care – PPO

## 2022-09-01 VITALS — BP 114/77 | HR 91

## 2022-09-01 DIAGNOSIS — Z7984 Long term (current) use of oral hypoglycemic drugs: Secondary | ICD-10-CM

## 2022-09-01 DIAGNOSIS — O09299 Supervision of pregnancy with other poor reproductive or obstetric history, unspecified trimester: Secondary | ICD-10-CM

## 2022-09-01 DIAGNOSIS — O09293 Supervision of pregnancy with other poor reproductive or obstetric history, third trimester: Secondary | ICD-10-CM | POA: Diagnosis not present

## 2022-09-01 DIAGNOSIS — O24313 Unspecified pre-existing diabetes mellitus in pregnancy, third trimester: Secondary | ICD-10-CM

## 2022-09-01 DIAGNOSIS — Z3A36 36 weeks gestation of pregnancy: Secondary | ICD-10-CM

## 2022-09-01 DIAGNOSIS — O24113 Pre-existing diabetes mellitus, type 2, in pregnancy, third trimester: Secondary | ICD-10-CM | POA: Diagnosis not present

## 2022-09-01 DIAGNOSIS — O0933 Supervision of pregnancy with insufficient antenatal care, third trimester: Secondary | ICD-10-CM | POA: Insufficient documentation

## 2022-09-01 DIAGNOSIS — Z8632 Personal history of gestational diabetes: Secondary | ICD-10-CM

## 2022-09-01 DIAGNOSIS — O99213 Obesity complicating pregnancy, third trimester: Secondary | ICD-10-CM

## 2022-09-01 DIAGNOSIS — R7309 Other abnormal glucose: Secondary | ICD-10-CM

## 2022-09-01 DIAGNOSIS — E669 Obesity, unspecified: Secondary | ICD-10-CM

## 2022-09-01 NOTE — Telephone Encounter (Signed)
Aamira left a message stating this message is for Antionette and that she got notification her FMLA paperwork was sent in incorrectly. She asked for a call back from Antionette. Nancy Fetter

## 2022-09-03 ENCOUNTER — Telehealth: Payer: Self-pay | Admitting: Family Medicine

## 2022-09-03 NOTE — Telephone Encounter (Signed)
Spoke with patient about missing information on her FMLA papers. She stated April 8,2024 and April 22,2024 were the only two days she needed added to her papers.

## 2022-09-06 ENCOUNTER — Encounter (HOSPITAL_COMMUNITY): Payer: Self-pay | Admitting: *Deleted

## 2022-09-06 ENCOUNTER — Telehealth (HOSPITAL_COMMUNITY): Payer: Self-pay | Admitting: *Deleted

## 2022-09-06 ENCOUNTER — Encounter: Payer: Self-pay | Admitting: Family Medicine

## 2022-09-06 ENCOUNTER — Other Ambulatory Visit: Payer: Self-pay

## 2022-09-06 ENCOUNTER — Other Ambulatory Visit (HOSPITAL_COMMUNITY)
Admission: RE | Admit: 2022-09-06 | Discharge: 2022-09-06 | Disposition: A | Discharge status: Home / Self Care | Payer: BC Managed Care – PPO | Source: Ambulatory Visit | Attending: Student | Admitting: Student

## 2022-09-06 ENCOUNTER — Ambulatory Visit (INDEPENDENT_AMBULATORY_CARE_PROVIDER_SITE_OTHER): Payer: BLUE CROSS/BLUE SHIELD | Admitting: Student

## 2022-09-06 VITALS — BP 134/92 | HR 85 | Wt 262.0 lb

## 2022-09-06 DIAGNOSIS — O0930 Supervision of pregnancy with insufficient antenatal care, unspecified trimester: Secondary | ICD-10-CM

## 2022-09-06 DIAGNOSIS — Z3480 Encounter for supervision of other normal pregnancy, unspecified trimester: Secondary | ICD-10-CM | POA: Diagnosis not present

## 2022-09-06 DIAGNOSIS — Z3A37 37 weeks gestation of pregnancy: Secondary | ICD-10-CM | POA: Diagnosis not present

## 2022-09-06 DIAGNOSIS — Z3493 Encounter for supervision of normal pregnancy, unspecified, third trimester: Secondary | ICD-10-CM | POA: Diagnosis not present

## 2022-09-06 DIAGNOSIS — O133 Gestational [pregnancy-induced] hypertension without significant proteinuria, third trimester: Secondary | ICD-10-CM | POA: Insufficient documentation

## 2022-09-06 DIAGNOSIS — O98319 Other infections with a predominantly sexual mode of transmission complicating pregnancy, unspecified trimester: Secondary | ICD-10-CM

## 2022-09-06 DIAGNOSIS — Z3009 Encounter for other general counseling and advice on contraception: Secondary | ICD-10-CM

## 2022-09-06 DIAGNOSIS — O24415 Gestational diabetes mellitus in pregnancy, controlled by oral hypoglycemic drugs: Secondary | ICD-10-CM | POA: Diagnosis not present

## 2022-09-06 DIAGNOSIS — B977 Papillomavirus as the cause of diseases classified elsewhere: Secondary | ICD-10-CM

## 2022-09-06 NOTE — Telephone Encounter (Signed)
Preadmission screen  

## 2022-09-06 NOTE — Progress Notes (Signed)
PRENATAL VISIT NOTE  Subjective:  Alexandria Ross is a 32 y.o. G3P1011 at 100w4d being seen today for ongoing prenatal care.  She is currently monitored for the following issues for this high-risk pregnancy and has Well woman exam; Supervision of other normal pregnancy, antepartum; History of gestational diabetes in prior pregnancy, currently pregnant; Unwanted fertility; and Gestational diabetes on their problem list.  Patient reports nausea and diarrhea after starting Metformin . Patient has since then stopped taking the medication. Reports elevated blood sugar readings after certain foods. Also, patient has noticed increased swelling in hands and legs. Reports swelling in her hands can sometimes be painful. Denies Headaches, blurred vision, or sharp abdominal pain.   Contractions: Irritability. Vag. Bleeding: None.  Movement: Present. Denies leaking of fluid.   The following portions of the patient's history were reviewed and updated as appropriate: allergies, current medications, past family history, past medical history, past social history, past surgical history and problem list.   Objective:   Vitals:   09/06/22 1106 09/06/22 1141  BP: (!) 136/91 (!) 134/92  Pulse: 86 85  Weight: 262 lb (118.8 kg)     Fetal Status: Fetal Heart Rate (bpm): 147   Movement: Present     General:  Alert, oriented and cooperative. Patient is in no acute distress.  Skin: Skin is warm and dry. No rash noted.   Cardiovascular: Normal heart rate noted  Respiratory: Normal respiratory effort, no problems with respiration noted  Abdomen: Soft, gravid, appropriate for gestational age.  Pain/Pressure: Present     Pelvic: Cervical exam performed in the presence of a chaperone Dilation: Closed Effacement (%): 20, 30 Station: Ballotable  Extremities: Normal range of motion.  Edema: Trace  Mental Status: Normal mood and affect. Normal behavior. Normal judgment and thought content.   Assessment and Plan:   Pregnancy: G3P1011 at [redacted]w[redacted]d  1. Supervision of other normal pregnancy, antepartum - Doing well overall, frequent fetal movement - Protein / creatinine ratio, urine - CBC - Comprehensive metabolic panel  2. [redacted] weeks gestation of pregnancy - GC/Chlamydia probe amp ([place])not at [facility] - Culture, beta strep (group b only)  3. Gestational diabetes mellitus (GDM) in third trimester controlled on oral hypoglycemic drug - Metformin RX for BID, patient not taking due to stomach upset - Blood sugars at home -  Pps: 111, 121, 134, 135, 139,135; ACs: 111, 114x,98 - Due to MFM recommendations, inability to tolerate Metformin, and uncontrolled sugars, recommend delivery at [redacted] weeks gestation. Consulted with Dr. Debroah Loop in setting of newly diagnosed GHTN to ensure 38 weeks was within safe recommendations. Provider agreed and IOL scheduling requested  4. Gestational hypertension, third trimester - New diagnosis. Patient report of at home bps WDL. However, has had elevated in-office readings today and 04/08 visit (121/100, 119/92, 136/91, and 134/92). Insetting of moderate swelling in extremities with pain, will collect baseline labs and recommend delivery at 38 weeks - Protein / creatinine ratio, urine - CBC - Comprehensive metabolic panel  5. Unwanted fertility - BTL papers signed at prior visit  6. 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 Limited prenatal care, antepartum   Term labor symptoms and general obstetric precautions including but not limited to vaginal bleeding, contractions, leaking of fluid and fetal movement were reviewed in detail with the patient. Please refer to After Visit Summary for other counseling recommendations.   No follow-ups on file.  Future Appointments  Date Time Provider Department Center  09/10/2022  7:15 AM [facility] NURSE [facility]  09/10/2022  7:30 AM [facility] US2 [facility]     Corlis Hove, NP

## 2022-09-07 LAB — COMPREHENSIVE METABOLIC PANEL
ALT: 10 IU/L (ref 0–32)
AST: 15 IU/L (ref 0–40)
Albumin/Globulin Ratio: 1.2 (ref 1.2–2.2)
Albumin: 3.2 g/dL — ABNORMAL LOW (ref 3.9–4.9)
Alkaline Phosphatase: 129 IU/L — ABNORMAL HIGH (ref 44–121)
BUN/Creatinine Ratio: 8 — ABNORMAL LOW (ref 9–23)
BUN: 5 mg/dL — ABNORMAL LOW (ref 6–20)
Bilirubin Total: 0.3 mg/dL (ref 0.0–1.2)
CO2: 19 mmol/L — ABNORMAL LOW (ref 20–29)
Calcium: 9.1 mg/dL (ref 8.7–10.2)
Chloride: 103 mmol/L (ref 96–106)
Creatinine, Ser: 0.59 mg/dL (ref 0.57–1.00)
Globulin, Total: 2.7 g/dL (ref 1.5–4.5)
Glucose: 118 mg/dL — ABNORMAL HIGH (ref 70–99)
Potassium: 4.1 mmol/L (ref 3.5–5.2)
Sodium: 136 mmol/L (ref 134–144)
Total Protein: 5.9 g/dL — ABNORMAL LOW (ref 6.0–8.5)
eGFR: 122 mL/min/{1.73_m2} (ref >59)

## 2022-09-07 LAB — CBC
Hematocrit: 36.2 % (ref 34.0–46.6)
Hemoglobin: 11.8 g/dL (ref 11.1–15.9)
MCH: 26.9 pg (ref 26.6–33.0)
MCHC: 32.6 g/dL (ref 31.5–35.7)
MCV: 83 fL (ref 79–97)
Platelets: 290 10*3/uL (ref 150–450)
RBC: 4.39 x10E6/uL (ref 3.77–5.28)
RDW: 13.9 % (ref 11.7–15.4)
WBC: 8.5 10*3/uL (ref 3.4–10.8)

## 2022-09-07 LAB — PROTEIN / CREATININE RATIO, URINE
Creatinine, Urine: 169.8 mg/dL
Protein, Ur: 15.7 mg/dL
Protein/Creat Ratio: 92 mg/g creat (ref 0–200)

## 2022-09-07 LAB — GC/CHLAMYDIA PROBE AMP (~~LOC~~) NOT AT ARMC
Chlamydia: NEGATIVE
Comment: NEGATIVE
Comment: NORMAL
Neisseria Gonorrhea: NEGATIVE

## 2022-09-08 ENCOUNTER — Other Ambulatory Visit: Payer: Self-pay | Admitting: Advanced Practice Midwife

## 2022-09-08 DIAGNOSIS — O133 Gestational [pregnancy-induced] hypertension without significant proteinuria, third trimester: Secondary | ICD-10-CM

## 2022-09-09 ENCOUNTER — Inpatient Hospital Stay (HOSPITAL_COMMUNITY)
Admission: AD | Admit: 2022-09-09 | Discharge: 2022-09-12 | DRG: 807 | Disposition: A | Discharge status: Home / Self Care | Payer: BLUE CROSS/BLUE SHIELD | Attending: Obstetrics and Gynecology | Admitting: Obstetrics and Gynecology

## 2022-09-09 ENCOUNTER — Inpatient Hospital Stay (HOSPITAL_COMMUNITY): Payer: BLUE CROSS/BLUE SHIELD | Admitting: Anesthesiology

## 2022-09-09 ENCOUNTER — Inpatient Hospital Stay (HOSPITAL_COMMUNITY): Payer: BLUE CROSS/BLUE SHIELD

## 2022-09-09 ENCOUNTER — Encounter (HOSPITAL_COMMUNITY): Payer: Self-pay | Admitting: Family Medicine

## 2022-09-09 ENCOUNTER — Other Ambulatory Visit: Payer: Self-pay

## 2022-09-09 DIAGNOSIS — O24425 Gestational diabetes mellitus in childbirth, controlled by oral hypoglycemic drugs: Secondary | ICD-10-CM | POA: Diagnosis not present

## 2022-09-09 DIAGNOSIS — O99824 Streptococcus B carrier state complicating childbirth: Secondary | ICD-10-CM | POA: Diagnosis not present

## 2022-09-09 DIAGNOSIS — Z3480 Encounter for supervision of other normal pregnancy, unspecified trimester: Secondary | ICD-10-CM

## 2022-09-09 DIAGNOSIS — Z3A38 38 weeks gestation of pregnancy: Secondary | ICD-10-CM

## 2022-09-09 DIAGNOSIS — O99820 Streptococcus B carrier state complicating pregnancy: Secondary | ICD-10-CM | POA: Diagnosis not present

## 2022-09-09 DIAGNOSIS — O99214 Obesity complicating childbirth: Secondary | ICD-10-CM | POA: Diagnosis not present

## 2022-09-09 DIAGNOSIS — Z87891 Personal history of nicotine dependence: Secondary | ICD-10-CM

## 2022-09-09 DIAGNOSIS — O2412 Pre-existing diabetes mellitus, type 2, in childbirth: Secondary | ICD-10-CM | POA: Diagnosis not present

## 2022-09-09 DIAGNOSIS — O134 Gestational [pregnancy-induced] hypertension without significant proteinuria, complicating childbirth: Secondary | ICD-10-CM | POA: Diagnosis not present

## 2022-09-09 LAB — CBC
HCT: 34.7 % — ABNORMAL LOW (ref 36.0–46.0)
HCT: 35.7 % — ABNORMAL LOW (ref 36.0–46.0)
Hemoglobin: 11.6 g/dL — ABNORMAL LOW (ref 12.0–15.0)
Hemoglobin: 12 g/dL (ref 12.0–15.0)
MCH: 27.4 pg (ref 26.0–34.0)
MCH: 27.9 pg (ref 26.0–34.0)
MCHC: 33.4 g/dL (ref 30.0–36.0)
MCHC: 33.6 g/dL (ref 30.0–36.0)
MCV: 81.8 fL (ref 80.0–100.0)
MCV: 83 fL (ref 80.0–100.0)
Platelets: 272 10*3/uL (ref 150–400)
Platelets: 253 10*3/uL (ref 150–400)
RBC: 4.24 MIL/uL (ref 3.87–5.11)
RBC: 4.3 MIL/uL (ref 3.87–5.11)
RDW: 14.4 % (ref 11.5–15.5)
RDW: 14.2 % (ref 11.5–15.5)
WBC: 7.7 10*3/uL (ref 4.0–10.5)
WBC: 11.3 10*3/uL — ABNORMAL HIGH (ref 4.0–10.5)
nRBC: 0 % (ref 0.0–0.2)
nRBC: 0 % (ref 0.0–0.2)

## 2022-09-09 LAB — COMPREHENSIVE METABOLIC PANEL
ALT: 11 U/L (ref 0–44)
AST: 17 U/L (ref 15–41)
Albumin: 2.4 g/dL — ABNORMAL LOW (ref 3.5–5.0)
Alkaline Phosphatase: 102 U/L (ref 38–126)
Anion gap: 10 (ref 5–15)
BUN: <5 mg/dL — ABNORMAL LOW (ref 6–20)
CO2: 21 mmol/L — ABNORMAL LOW (ref 22–32)
Calcium: 8.6 mg/dL — ABNORMAL LOW (ref 8.9–10.3)
Chloride: 104 mmol/L (ref 98–111)
Creatinine, Ser: 0.7 mg/dL (ref 0.44–1.00)
GFR, Estimated: >60 mL/min (ref >60)
Glucose, Bld: 103 mg/dL — ABNORMAL HIGH (ref 70–99)
Potassium: 3.8 mmol/L (ref 3.5–5.1)
Sodium: 135 mmol/L (ref 135–145)
Total Bilirubin: 0.2 mg/dL — ABNORMAL LOW (ref 0.3–1.2)
Total Protein: 6 g/dL — ABNORMAL LOW (ref 6.5–8.1)

## 2022-09-09 LAB — CULTURE, BETA STREP (GROUP B ONLY): Strep Gp B Culture: POSITIVE — AB

## 2022-09-09 LAB — GLUCOSE, CAPILLARY
Glucose-Capillary: 106 mg/dL — ABNORMAL HIGH (ref 70–99)
Glucose-Capillary: 94 mg/dL (ref 70–99)

## 2022-09-09 LAB — TYPE AND SCREEN
ABO/RH(D): O POS
Antibody Screen: NEGATIVE

## 2022-09-09 LAB — PROTEIN / CREATININE RATIO, URINE
Creatinine, Urine: 150 mg/dL
Protein Creatinine Ratio: 0.11 mg/mg{Cre} (ref 0.00–0.15)
Total Protein, Urine: 17 mg/dL

## 2022-09-09 LAB — RPR: RPR Ser Ql: NONREACTIVE

## 2022-09-09 MED ORDER — OXYTOCIN BOLUS FROM INFUSION
333.0000 mL | Freq: Once | INTRAVENOUS | Status: AC
  Administered 2022-09-11: 333 mL via INTRAVENOUS

## 2022-09-09 MED ORDER — EPHEDRINE 5 MG/ML INJ
10.0000 mg | INTRAVENOUS | Status: DC | PRN
  Administered 2022-09-10: 10 mg via INTRAVENOUS
  Filled 2022-09-09: qty 5

## 2022-09-09 MED ORDER — PHENYLEPHRINE 80 MCG/ML (10ML) SYRINGE FOR IV PUSH (FOR BLOOD PRESSURE SUPPORT): 80.0000 ug | PREFILLED_SYRINGE | INTRAVENOUS | Status: DC | PRN

## 2022-09-09 MED ORDER — TERBUTALINE SULFATE 1 MG/ML IJ SOLN: 0.2500 mg | Freq: Once | INTRAMUSCULAR | Status: DC | PRN

## 2022-09-09 MED ORDER — OXYCODONE-ACETAMINOPHEN 5-325 MG PO TABS: 2.0000 | ORAL_TABLET | ORAL | Status: DC | PRN

## 2022-09-09 MED ORDER — EPHEDRINE 5 MG/ML INJ: 10.0000 mg | INTRAVENOUS | Status: DC | PRN

## 2022-09-09 MED ORDER — DIPHENHYDRAMINE HCL 50 MG/ML IJ SOLN: 12.5000 mg | INTRAMUSCULAR | Status: DC | PRN

## 2022-09-09 MED ORDER — SODIUM CHLORIDE 0.9 % IV SOLN
5.0000 10*6.[IU] | Freq: Once | INTRAVENOUS | Status: AC
  Administered 2022-09-09: 5 10*6.[IU] via INTRAVENOUS
  Filled 2022-09-09: qty 5

## 2022-09-09 MED ORDER — LIDOCAINE HCL (PF) 1 % IJ SOLN: 30.0000 mL | INTRAMUSCULAR | Status: DC | PRN

## 2022-09-09 MED ORDER — OXYTOCIN-SODIUM CHLORIDE 30-0.9 UT/500ML-% IV SOLN
1.0000 m[IU]/min | INTRAVENOUS | Status: DC
  Administered 2022-09-09: 2 m[IU]/min via INTRAVENOUS
  Administered 2022-09-10: 6 m[IU]/min via INTRAVENOUS

## 2022-09-09 MED ORDER — LACTATED RINGERS IV SOLN
500.0000 mL | Freq: Once | INTRAVENOUS | Status: AC
  Administered 2022-09-09: 500 mL via INTRAVENOUS

## 2022-09-09 MED ORDER — ACETAMINOPHEN 325 MG PO TABS: 650.0000 mg | ORAL_TABLET | ORAL | Status: DC | PRN

## 2022-09-09 MED ORDER — OXYCODONE-ACETAMINOPHEN 5-325 MG PO TABS: 1.0000 | ORAL_TABLET | ORAL | Status: DC | PRN

## 2022-09-09 MED ORDER — PENICILLIN G POT IN DEXTROSE 60000 UNIT/ML IV SOLN
3.0000 10*6.[IU] | INTRAVENOUS | Status: DC
  Administered 2022-09-09 – 2022-09-10 (×8): 3 10*6.[IU] via INTRAVENOUS
  Filled 2022-09-09 (×11): qty 50

## 2022-09-09 MED ORDER — FENTANYL-BUPIVACAINE-NACL 0.5-0.125-0.9 MG/250ML-% EP SOLN
12.0000 mL/h | EPIDURAL | Status: DC | PRN
  Administered 2022-09-10 (×2): 12 mL/h via EPIDURAL
  Filled 2022-09-09 (×2): qty 250

## 2022-09-09 MED ORDER — FENTANYL CITRATE (PF) 100 MCG/2ML IJ SOLN: 100.0000 ug | INTRAMUSCULAR | Status: DC | PRN

## 2022-09-09 MED ORDER — ONDANSETRON HCL 4 MG/2ML IJ SOLN
4.0000 mg | Freq: Four times a day (QID) | INTRAMUSCULAR | Status: DC | PRN
  Administered 2022-09-10: 4 mg via INTRAVENOUS
  Filled 2022-09-09: qty 2

## 2022-09-09 MED ORDER — MISOPROSTOL 25 MCG QUARTER TABLET
25.0000 ug | ORAL_TABLET | Freq: Once | ORAL | Status: AC
  Administered 2022-09-09: 25 ug via VAGINAL
  Filled 2022-09-09: qty 1

## 2022-09-09 MED ORDER — LACTATED RINGERS IV SOLN: INTRAVENOUS | Status: DC

## 2022-09-09 MED ORDER — LACTATED RINGERS IV SOLN
500.0000 mL | INTRAVENOUS | Status: DC | PRN
  Administered 2022-09-10: 500 mL via INTRAVENOUS

## 2022-09-09 MED ORDER — OXYTOCIN-SODIUM CHLORIDE 30-0.9 UT/500ML-% IV SOLN
2.5000 [IU]/h | INTRAVENOUS | Status: DC
  Filled 2022-09-09: qty 500

## 2022-09-09 MED ORDER — SOD CITRATE-CITRIC ACID 500-334 MG/5ML PO SOLN: 30.0000 mL | ORAL | Status: DC | PRN

## 2022-09-09 MED ORDER — MISOPROSTOL 50MCG HALF TABLET
50.0000 ug | ORAL_TABLET | Freq: Once | ORAL | Status: AC
  Administered 2022-09-09: 50 ug via ORAL
  Filled 2022-09-09: qty 1

## 2022-09-09 NOTE — Anesthesia Preprocedure Evaluation (Signed)
Anesthesia Evaluation  Patient identified by MRN, date of birth, ID band Patient awake    Reviewed: Allergy & Precautions, H&P , NPO status , Patient's Chart, lab work & pertinent test results  History of Anesthesia Complications Negative for: history of anesthetic complications  Airway Mallampati: II  TM Distance: >3 FB Neck ROM: full    Dental no notable dental hx. (+) Teeth Intact   Pulmonary neg pulmonary ROS, former smoker   Pulmonary exam normal breath sounds clear to auscultation       Cardiovascular negative cardio ROS Normal cardiovascular exam Rhythm:regular Rate:Normal     Neuro/Psych negative neurological ROS  negative psych ROS   GI/Hepatic negative GI ROS, Neg liver ROS,,,  Endo/Other  diabetes  Morbid obesity  Renal/GU negative Renal ROS  negative genitourinary   Musculoskeletal   Abdominal  (+) + obese  Peds  Hematology negative hematology ROS (+)   Anesthesia Other Findings   Reproductive/Obstetrics (+) Pregnancy                             Anesthesia Physical Anesthesia Plan  ASA: 3  Anesthesia Plan: Epidural   Post-op Pain Management:    Induction:   PONV Risk Score and Plan:   Airway Management Planned:   Additional Equipment:   Intra-op Plan:   Post-operative Plan:   Informed Consent: I have reviewed the patients History and Physical, chart, labs and discussed the procedure including the risks, benefits and alternatives for the proposed anesthesia with the patient or authorized representative who has indicated his/her understanding and acceptance.       Plan Discussed with:   Anesthesia Plan Comments:        Anesthesia Quick Evaluation

## 2022-09-09 NOTE — Plan of Care (Signed)

## 2022-09-09 NOTE — Progress Notes (Signed)
Patient ID: Alexandria Ross, female   DOB: [birth year], 32 y.o.   MRN: [id number]  Labor Progress Note [name] is a 32 y.o. G3P1011 at [redacted]w[redacted]d presented for IOL for T2DM  S:  Pt has been ambulating with her family, now resting in bed, says contractions have gotten stronger.  O:  BP 125/88   Pulse 78   Temp 98.2 F (36.8 C) (Axillary)   Resp 15   LMP 12/17/2021  EFM: baseline 145 bpm/ moderate variability/ 15x15 accels/ no decels  Toco/IUPC: q1-37min SVE: Dilation: 1 Effacement (%): 80 Cervical Position: Posterior Station: -1 Presentation: Vertex Exam by:: [name], CNM Pitocin: to start  A/P: 32 y.o. G3P1011 [redacted]w[redacted]d  1. Labor: Latent, progressing well on first dose of cytotec. Because cervix is softer and head low, will start pitocin. Anticipate AROM at next cervical check. In her last labor, pt progressed to complete within an hour of AROM. 2. FWB: Cat 1 3. Pain: Planning epidural 4. DM2: 106  Anticipate SVD.  [name], CNM 7:05 PM

## 2022-09-09 NOTE — H&P (Addendum)
OBSTETRIC ADMISSION HISTORY AND PHYSICAL  Alexandria Ross is a 32 y.o. female G3P1011 with IUP at [redacted]w[redacted]d by LMP presenting for IOL due to GDM. She reports +FMs, No LOF, no VB, no blurry vision, headaches or peripheral edema, and RUQ pain.  She plans on breast feeding. She request depo for birth control. She received her prenatal care at  [facility]    Dating: By LMP --->  Estimated Date of Delivery: 09/23/22 Sono: , CWD, normal anatomy, cephalic presentation, anterior placenta, 3468 g, 89% EFW   Prenatal History/Complications: pre gestational DM, late to prenatal care, GBS+       Nursing Staff Provider  Office Location [facility] Dating  09/23/2022, by Last Menstrual Period  [facility] Model [ x] Traditional  Centering  Mom-Baby Dyad      Language  English Anatomy US   07/05/22, normal, serial growth and antenatal testing due to DM  Flu Vaccine  Declined-06/01/22 Genetic/Carrier Screen  NIPS:   Negative AFP:    Horizon: neg x 4  TDaP Vaccine   declined Hgb A1C or  GTT Early 6.8 Third trimester late 1 hr abnormal  COVID Vaccine No   LAB RESULTS   Rhogam    NA Blood Type   O positive  Baby Feeding Plan Breast Antibody  negative  Contraception BTL Rubella  immune  Circumcision Yes RPR   negative  Pediatrician  [facility] HBsAg   negative  Support Person Justin(FOB) HCVAb   negative  Prenatal Classes   HIV     negative  BTL Consent  08/23/22 GBS (For PCN allergy, check sensitivities)   VBAC Consent NA Pap  LGSIL           DME Rx Arly.Keller ] BP cuff  Weight Scale Waterbirth   Class  Consent  CNM visit  PHQ9 & GAD7 [  ] new OB [  ] 28 weeks  [  ] 36 weeks Induction   Orders Entered Foley Y/N    Past Medical History: Past Medical History:  Diagnosis Date   Abnormal Pap smear    ASC-US, HPV +   Chlamydia 1 year ago   Gestational diabetes    History of gestational diabetes in prior pregnancy, currently pregnant 06/04/2022   A1 with prior preg   Past  Surgical History: History reviewed. No pertinent surgical history.  Obstetrical History: OB History     Gravida  3   Para  1   Term  1   Preterm  0   AB  1   Living  1      SAB  0   IAB  1   Ectopic  0   Multiple  0   Live Births  1          Social History Social History   Socioeconomic History   Marital status: Single    Spouse name: Not on file   Number of children: 1   Years of education: Not on file   Highest education level: Not on file  Occupational History   Not on file  Tobacco Use   Smoking status: Former   Smokeless tobacco: Never  Vaping Use   Vaping Use: Never used  Substance and Sexual Activity   Alcohol use: Yes    Comment: occ wine   Drug use: No   Sexual activity: Yes    Birth control/protection: Injection  Other Topics Concern  Not on file  Social History Narrative   Not on file   Social Determinants of Health   Financial Resource Strain: Not on file  Food Insecurity: No Food Insecurity (09/09/2022)   Hunger Vital Sign    Worried About Running Out of Food in the Last Year: Never true    Ran Out of Food in the Last Year: Never true  Transportation Needs: No Transportation Needs (09/09/2022)   PRAPARE - Administrator, Civil Service (Medical): No    Lack of Transportation (Non-Medical): No  Physical Activity: Not on file  Stress: Not on file  Social Connections: Not on file   Family History: Family History  Problem Relation Age of Onset   Diabetes Mother    Asthma Sister    Miscarriages / [place] Sister    Allergies: No Known Allergies  Medications Prior to Admission  Medication Sig Dispense Refill Last Dose   Prenatal Vit-Fe Fumarate-FA (PREPLUS) 27-1 MG TABS Take 1 tablet by mouth daily. 30 tablet 11 Past Week   Accu-Chek Softclix Lancets lancets 100 each by Other route 4 (four) times daily. 100 each 12    Blood Glucose Monitoring Suppl (ACCU-CHEK GUIDE) w/Device KIT 1 kit by Does not apply route  daily. 1 kit 0    glucose blood (ACCU-CHEK GUIDE) test strip 4 times per day 100 each 12    metFORMIN (GLUCOPHAGE) 500 MG tablet Take 1 tablet (500 mg total) by mouth 2 (two) times daily. 60 tablet 5 Unknown   Review of Systems  All systems reviewed and negative except as stated in HPI  Blood pressure 124/86, pulse 83, temperature 98.2 F (36.8 C), temperature source Axillary, resp. rate 15, last menstrual period 12/17/2021. General appearance: alert, cooperative, and no distress Lungs: clear to auscultation bilaterally Heart: regular rate and rhythm Abdomen: soft, non-tender; bowel sounds normal Pelvic: normal external female genitalia Extremities: Homans sign is negative, no sign of DVT Presentation: cephalic Fetal monitoring - Baseline: 135 bpm, Variability: Good {> 6 bpm), Accelerations: Reactive, and Decelerations: Variable: moderate Uterine activity - Frequency: Every 2-6 minutes Dilation: Closed Effacement (%): 20 Station: -3 Exam by:: Fredia Beets, RN  Prenatal labs: ABO, Rh: --/--/O POS (04/25 1210) Antibody: NEG (04/25 1210) Rubella: 1.29 (01/19 1012) RPR: NON REACTIVE (04/25 1208)  HBsAg: Negative (01/19 1012)  HIV: Non Reactive (04/03 0824)  GBS: Positive/-- (04/22 1140)  1 hr Glucola abnormal (1 hour - 237) Genetic screening  NIPS LR Anatomy US normal  Prenatal Transfer Tool  Maternal Diabetes: Yes:  Diabetes Type:  Pre-pregnancy Genetic Screening: Normal Maternal Ultrasounds/Referrals: Normal Fetal Ultrasounds or other Referrals:  Fetal echo no concerns on fetal echo performed at [facility] Maternal Substance Abuse:  No Significant Maternal Medications:  Meds include: Other:  Significant Maternal Lab Results:  Group B Strep positive Number of Prenatal Visits:greater than 3 verified prenatal visits Other Comments:  None  Results for orders placed or performed during the hospital encounter of 09/09/22 (from the past 24 hour(s))  CBC   Collection Time: 09/09/22  12:08 PM  Result Value Ref Range   WBC 7.7 4.0 - 10.5 K/uL   RBC 4.24 3.87 - 5.11 MIL/uL   Hemoglobin 11.6 (L) 12.0 - 15.0 g/dL   HCT 81.1 (L) 91.4 - 78.2 %   MCV 81.8 80.0 - 100.0 fL   MCH 27.4 26.0 - 34.0 pg   MCHC 33.4 30.0 - 36.0 g/dL   RDW 95.6 21.3 - 08.6 %   Platelets 272 150 -  400 K/uL   nRBC 0.0 0.0 - 0.2 %  RPR   Collection Time: 09/09/22 12:08 PM  Result Value Ref Range   RPR Ser Ql NON REACTIVE NON REACTIVE  Type and screen   Collection Time: 09/09/22 12:10 PM  Result Value Ref Range   ABO/RH(D) O POS    Antibody Screen NEG    Sample Expiration      09/12/2022,2359 Performed at [facility], [address] [place], [place] [address]   Glucose, capillary   Collection Time: 09/09/22  4:25 PM  Result Value Ref Range   Glucose-Capillary 106 (H) 70 - 99 mg/dL   Patient Active Problem List   Diagnosis Date Noted   Type 2 diabetes mellitus 08/19/2022   Unwanted fertility 06/04/2022   Supervision of other normal pregnancy, antepartum 06/01/2022   Assessment/Plan:  Alexandria Ross is a 32 y.o. G3P1011 at [redacted]w[redacted]d here for IOL for T2DM (metformin bid)  Prenatal History/Complications: T2DM, late to prenatal care, GBS+, 9 years since last pregnancy   #Labor: Started with loading dose of cytotec ( oral + vaginal) #Pain: Planning epidural #FWB: CAT 2 #ID:  GBS+ --> PCN #MOF: Breast #MOC: Depo (changed her mind re BTL) #Circ:  Female #T2DM: q4hr CBG  [name], CNM  09/09/2022, 5:26 PM

## 2022-09-09 NOTE — Progress Notes (Signed)
Labor Progress Note Alexandria Ross is a 31 y.o. G3P1011 at [redacted]w[redacted]d presented for IOL d/t T2DM  S: Doing well. Comfortable and seating in throne positioning. Contractions have continued and are getting stronger and more uncomfortable. She desires an epidural at this time.    O:  BP 133/88 (BP Location: Right Arm)   Pulse 75   Temp 98.1 F (36.7 C) (Oral)   Resp 18   LMP 12/17/2021   SpO2 100%  EFM: 150 bpm/6-25 bpm/15x15 accels/no decels   CVE: Dilation: 3 Effacement (%): 60 Cervical Position: Posterior Station: -3 Presentation: Vertex Exam by:: Dr Ladon Applebaum  A&P: 32 y.o. G3P1011 [redacted]w[redacted]d d/t T2DM.  #Labor: Progressing well. Now at 3cm since last check and on pit at 2u currently. Plan for epidural followed by escalation of pitocin. Plan for likely AROM at next check.  #Pain: Becoming more uncomfortable but tolerable overall. Plan for epidural when labs result.  #FWB: Cat 1 #GBS positive - continues on PCN ppx #gHTN: Pressures elevated on admission to 142/98 but have since normalized. We will check PreE labs prior to obtaining epidural.  #T2DM: Last check 94 at 2100  Alexandria Ross, MS3 [facility]  09/09/22 9:52 PM

## 2022-09-10 ENCOUNTER — Ambulatory Visit: Payer: BC Managed Care – PPO

## 2022-09-10 LAB — GLUCOSE, CAPILLARY
Glucose-Capillary: 104 mg/dL — ABNORMAL HIGH (ref 70–99)
Glucose-Capillary: 105 mg/dL — ABNORMAL HIGH (ref 70–99)
Glucose-Capillary: 105 mg/dL — ABNORMAL HIGH (ref 70–99)
Glucose-Capillary: 110 mg/dL — ABNORMAL HIGH (ref 70–99)
Glucose-Capillary: 111 mg/dL — ABNORMAL HIGH (ref 70–99)
Glucose-Capillary: 130 mg/dL — ABNORMAL HIGH (ref 70–99)
Glucose-Capillary: 133 mg/dL — ABNORMAL HIGH (ref 70–99)
Glucose-Capillary: 146 mg/dL — ABNORMAL HIGH (ref 70–99)
Glucose-Capillary: 161 mg/dL — ABNORMAL HIGH (ref 70–99)

## 2022-09-10 MED ORDER — INSULIN REGULAR(HUMAN) IN NACL 100-0.9 UT/100ML-% IV SOLN
INTRAVENOUS | Status: DC
  Administered 2022-09-10: 2 [IU]/h via INTRAVENOUS
  Administered 2022-09-11: 2.4 [IU]/h via INTRAVENOUS
  Filled 2022-09-10: qty 100

## 2022-09-10 MED ORDER — LACTATED RINGERS IV SOLN: INTRAVENOUS | Status: DC

## 2022-09-10 MED ORDER — DEXTROSE 50 % IV SOLN: 0.0000 mL | INTRAVENOUS | Status: DC | PRN

## 2022-09-10 MED ORDER — DEXTROSE IN LACTATED RINGERS 5 % IV SOLN: INTRAVENOUS | Status: DC

## 2022-09-10 MED ORDER — LIDOCAINE-EPINEPHRINE (PF) 1.5 %-1:200000 IJ SOLN
INTRAMUSCULAR | Status: DC | PRN
  Administered 2022-09-09: 5 mL via EPIDURAL

## 2022-09-10 MED ORDER — LIDOCAINE HCL (PF) 1 % IJ SOLN
INTRAMUSCULAR | Status: DC | PRN
  Administered 2022-09-09: 5 mL via EPIDURAL

## 2022-09-10 NOTE — Anesthesia Procedure Notes (Addendum)
Epidural Patient location during procedure: OB Start time: 09/09/2022 11:39 PM End time: 09/09/2022 11:54 PM  Staffing Anesthesiologist: Leonides Grills, MD Performed: anesthesiologist   Preanesthetic Checklist Completed: patient identified, IV checked, site marked, risks and benefits discussed, monitors and equipment checked, pre-op evaluation and timeout performed  Epidural Patient position: sitting Prep: DuraPrep Patient monitoring: heart rate, cardiac monitor, continuous pulse ox and blood pressure Approach: midline Location: L3-L4 Injection technique: LOR air  Needle:  Needle type: Tuohy  Needle gauge: 17 G Needle length: 9 cm Needle insertion depth: 10 cm Catheter type: closed end flexible Catheter size: 19 Gauge Catheter at skin depth: 16 cm Test dose: negative and 1.5% lidocaine with Epi 1:200 K  Assessment Events: blood not aspirated, no cerebrospinal fluid, injection not painful, no injection resistance and negative IV test  Additional Notes Informed consent obtained prior to proceeding including risk of failure, 1% risk of PDPH, risk of minor discomfort and bruising. Discussed alternatives to epidural analgesia and patient desires to proceed.  Timeout performed pre-procedure verifying patient name, procedure, and platelet count.  Loss of resistance encountered on the second attempt. Patient tolerated procedure well. Reason for block:procedure for pain

## 2022-09-10 NOTE — Progress Notes (Signed)
CBG collected. Result was 133. Patient was eating italian ice 30 minute prior to blood sugar check.

## 2022-09-10 NOTE — Progress Notes (Signed)
Labor Progress Note Alexandria Ross is a 32 y.o. G3P1011 at [redacted]w[redacted]d presented for IOL d/t T2DM  S: feels well epidural in place and comfortable. Some recurrent FHR decel noted again and pitocin has been dc'd  O:  BP (!) 128/92   Pulse 91   Temp 98.1 F (36.7 C) (Oral)   Resp 18   LMP 12/17/2021   SpO2 100%   EFM: 160 bpm / moderate variability / +accels, intermittent late decels noted earlier, but currently looking good, with no decels.  CVE: Dilation: 4 Effacement (%): 60 Cervical Position: Posterior Station: -3 Presentation: Vertex Exam by:: Dr Ladon Applebaum  A&P: 32 y.o. G3P1011 [redacted]w[redacted]d d/t T2DM.  #Labor: no cervical change. Fetal tachycardia likely reactive. No maternal fever, membranes are intact, hence no concern for chorio at this time.  Ok to continue to monitor. Pitocin off since 0315. Contractions are often and palpate strong, so will monitor for now - plan to recheck in 4 hrs, sooner if indicated. #Pain: epidural in place and comfortable. #FWB: Cat 1 #GBS positive - continues on PCN ppx #gHTN: blood pressures normal. Pre-e labs negative. #T2DM: 105 gluc @ 0058  Sheppard Evens MD MPH OB Fellow, Faculty Practice [facility], Center for [facility] 09/10/2022

## 2022-09-10 NOTE — Progress Notes (Signed)
Labor Progress Note Nadie Fiumara is a 32 y.o. G3P1011 at [redacted]w[redacted]d presented for IOL d/t T2DM  S: comfortable now with epidural. Had episodes of significantly low BP following epidural and resulted in in HR decels. Now improved. Baby is tachycardic in response to phenyephrine.  O:  BP 117/80   Pulse 97   Temp 98.1 F (36.7 C) (Oral)   Resp 18   LMP 12/17/2021   SpO2 100%  EFM: 155bpm, moderate variabiltiy, +accels, no decels.  CVE: Dilation: 4 Effacement (%): 60 Cervical Position: Posterior Station: -3 Presentation: Vertex Exam by:: A.Mathis RN  A&P: 32 y.o. G3P1011 [redacted]w[redacted]d d/t T2DM.  #Labor: making progress. Still on pit at 2 units. Goal to hold on to that for about the next 1hr to allow FHR to settle post epidural hypotensive episode. Consider AROM #Pain: epidural in place and comfortable. #FWB: Cat 1 #GBS positive - continues on PCN ppx #gHTN: blood pressures normal. Pre-e labs negative. #T2DM: 105 gluc @ 0058  Sheppard Evens MD MPH OB Fellow, Faculty Practice [facility], Center for [facility] 09/10/2022

## 2022-09-10 NOTE — Progress Notes (Signed)
Labor Progress Note  Alexandria Ross is a 32 y.o. G3P1011 at [redacted]w[redacted]d presented for IOL for T2DM (metformin bid)  S: No acute events. Spoke with patient about AROM. Patient agreeable   O:  BP 111/73   Pulse 78   Temp 97.6 F (36.4 C) (Oral)   Resp 18   LMP 12/17/2021   SpO2 100%  EFM: 150bpm/Moderate variability/ 15x15 accels/ None decels  CVE: Dilation: 4.5 Effacement (%): 60 Cervical Position: Posterior Station: -2 Presentation: Vertex Exam by:: Sharen Counter CNM   A&P: 32 y.o. G3P1011 [redacted]w[redacted]d  here for IOL for T2DM (metformin bid)  Prenatal History/Complications: T2DM, late to prenatal care, GBS+, 9 years since last pregnancy   #Labor: Cytotec 50/25. AROM, clear (1400). Pitocin 6 #Pain:  Planning epidural #FWB: CAT 1 #ID:      GBS+ --> PCN #MOF: Breast #MOC: Depo (changed her mind re BTL) #Circ:   Female #T2DM: q4hr CBG during latent labor, q2h CBG during active labor  NIKE PGY-3 09/10/22  2:07 PM

## 2022-09-10 NOTE — Progress Notes (Signed)
Labor Progress Note  Alexandria Ross is a 32 y.o. G3P1011 at [redacted]w[redacted]d presented for IOL for T2DM (metformin bid)  S: doing well. Blood sugars noted to be elevated in the 160s.  O:  BP 133/79   Pulse 84   Temp 98.2 F (36.8 C) (Oral)   Resp 18   LMP 12/17/2021   SpO2 100%   EFM: 145bpm, moderate variability, + accels, no decels  CVE: Dilation: 9 Effacement (%): 90 Cervical Position: Posterior Station: -1 Presentation: Vertex Exam by:: A.Mathis RN   A&P: 32 y.o. G3P1011 [redacted]w[redacted]d here for IOL for T2DM (metformin bid)  Prenatal History/Complications: T2DM, late to prenatal care, GBS+, 9 years since last pregnancy   #Labor: Cytotec 50/25. AROM, clear (1400). Pitocin 6. Making progress. Anticipate NSVD #Pain:  Planning epidural #FWB: CAT 1 #ID:      GBS+ --> PCN #MOF: Breast #MOC: Depo (changed her mind re BTL) #Circ:   Female #T2DM: gluc 161. Discussed with patient. Will start endotool.  Sheppard Evens MD MPH OB Fellow, Faculty Practice [facility], Center for [facility] 09/10/2022

## 2022-09-11 ENCOUNTER — Encounter (HOSPITAL_COMMUNITY): Payer: Self-pay | Admitting: Family Medicine

## 2022-09-11 DIAGNOSIS — O99820 Streptococcus B carrier state complicating pregnancy: Secondary | ICD-10-CM

## 2022-09-11 DIAGNOSIS — O134 Gestational [pregnancy-induced] hypertension without significant proteinuria, complicating childbirth: Secondary | ICD-10-CM

## 2022-09-11 DIAGNOSIS — O2412 Pre-existing diabetes mellitus, type 2, in childbirth: Secondary | ICD-10-CM

## 2022-09-11 DIAGNOSIS — Z3A38 38 weeks gestation of pregnancy: Secondary | ICD-10-CM

## 2022-09-11 LAB — CBC
HCT: 33.8 % — ABNORMAL LOW (ref 36.0–46.0)
HCT: 33.2 % — ABNORMAL LOW (ref 36.0–46.0)
Hemoglobin: 11.3 g/dL — ABNORMAL LOW (ref 12.0–15.0)
Hemoglobin: 11 g/dL — ABNORMAL LOW (ref 12.0–15.0)
MCH: 27.6 pg (ref 26.0–34.0)
MCH: 27.5 pg (ref 26.0–34.0)
MCHC: 33.4 g/dL (ref 30.0–36.0)
MCHC: 33.1 g/dL (ref 30.0–36.0)
MCV: 82.4 fL (ref 80.0–100.0)
MCV: 83 fL (ref 80.0–100.0)
Platelets: 246 10*3/uL (ref 150–400)
Platelets: 244 10*3/uL (ref 150–400)
RBC: 4.1 MIL/uL (ref 3.87–5.11)
RBC: 4 MIL/uL (ref 3.87–5.11)
RDW: 14.3 % (ref 11.5–15.5)
RDW: 14.2 % (ref 11.5–15.5)
WBC: 13 10*3/uL — ABNORMAL HIGH (ref 4.0–10.5)
WBC: 12.2 10*3/uL — ABNORMAL HIGH (ref 4.0–10.5)
nRBC: 0 % (ref 0.0–0.2)
nRBC: 0 % (ref 0.0–0.2)

## 2022-09-11 LAB — GLUCOSE, CAPILLARY
Glucose-Capillary: 108 mg/dL — ABNORMAL HIGH (ref 70–99)
Glucose-Capillary: 129 mg/dL — ABNORMAL HIGH (ref 70–99)
Glucose-Capillary: 131 mg/dL — ABNORMAL HIGH (ref 70–99)
Glucose-Capillary: 143 mg/dL — ABNORMAL HIGH (ref 70–99)
Glucose-Capillary: 144 mg/dL — ABNORMAL HIGH (ref 70–99)

## 2022-09-11 MED ORDER — SENNOSIDES-DOCUSATE SODIUM 8.6-50 MG PO TABS
2.0000 | ORAL_TABLET | Freq: Every day | ORAL | Status: DC
  Administered 2022-09-11 – 2022-09-12 (×2): 2 via ORAL
  Filled 2022-09-11 (×2): qty 2

## 2022-09-11 MED ORDER — ONDANSETRON HCL 4 MG PO TABS: 4.0000 mg | ORAL_TABLET | ORAL | Status: DC | PRN

## 2022-09-11 MED ORDER — ONDANSETRON HCL 4 MG/2ML IJ SOLN: 4.0000 mg | INTRAMUSCULAR | Status: DC | PRN

## 2022-09-11 MED ORDER — PRENATAL MULTIVITAMIN CH
1.0000 | ORAL_TABLET | Freq: Every day | ORAL | Status: DC
  Administered 2022-09-11 – 2022-09-12 (×2): 1 via ORAL
  Filled 2022-09-11 (×2): qty 1

## 2022-09-11 MED ORDER — OXYCODONE-ACETAMINOPHEN 5-325 MG PO TABS: 1.0000 | ORAL_TABLET | ORAL | Status: DC | PRN

## 2022-09-11 MED ORDER — COCONUT OIL OIL: 1.0000 | TOPICAL_OIL | Status: DC | PRN

## 2022-09-11 MED ORDER — TETANUS-DIPHTH-ACELL PERTUSSIS 5-2.5-18.5 LF-MCG/0.5 IM SUSY: 0.5000 mL | PREFILLED_SYRINGE | Freq: Once | INTRAMUSCULAR | Status: DC

## 2022-09-11 MED ORDER — SIMETHICONE 80 MG PO CHEW: 80.0000 mg | CHEWABLE_TABLET | ORAL | Status: DC | PRN

## 2022-09-11 MED ORDER — DIPHENHYDRAMINE HCL 25 MG PO CAPS: 25.0000 mg | ORAL_CAPSULE | Freq: Four times a day (QID) | ORAL | Status: DC | PRN

## 2022-09-11 MED ORDER — WITCH HAZEL-GLYCERIN EX PADS: 1.0000 | MEDICATED_PAD | CUTANEOUS | Status: DC | PRN

## 2022-09-11 MED ORDER — MEDROXYPROGESTERONE ACETATE 150 MG/ML IM SUSP: 150.0000 mg | INTRAMUSCULAR | Status: DC | PRN

## 2022-09-11 MED ORDER — BENZOCAINE-MENTHOL 20-0.5 % EX AERO: 1.0000 | INHALATION_SPRAY | CUTANEOUS | Status: DC | PRN

## 2022-09-11 MED ORDER — OXYCODONE-ACETAMINOPHEN 5-325 MG PO TABS: 2.0000 | ORAL_TABLET | ORAL | Status: DC | PRN

## 2022-09-11 MED ORDER — LACTATED RINGERS IV SOLN: INTRAVENOUS | Status: DC

## 2022-09-11 MED ORDER — ZOLPIDEM TARTRATE 5 MG PO TABS: 5.0000 mg | ORAL_TABLET | Freq: Every evening | ORAL | Status: DC | PRN

## 2022-09-11 MED ORDER — ACETAMINOPHEN 325 MG PO TABS: 650.0000 mg | ORAL_TABLET | ORAL | Status: DC | PRN

## 2022-09-11 MED ORDER — IBUPROFEN 600 MG PO TABS
600.0000 mg | ORAL_TABLET | Freq: Four times a day (QID) | ORAL | Status: DC
  Administered 2022-09-11 – 2022-09-12 (×5): 600 mg via ORAL
  Filled 2022-09-11 (×5): qty 1

## 2022-09-11 MED ORDER — DIBUCAINE (PERIANAL) 1 % EX OINT: 1.0000 | TOPICAL_OINTMENT | CUTANEOUS | Status: DC | PRN

## 2022-09-11 NOTE — Lactation Note (Signed)
This note was copied from a baby's chart. Lactation Consultation Note  Patient Name: Alexandria Ross [id number] Date: 09/11/2022 Age:34 hours Reason for consult: Initial assessment  P2, Mother concerned she does not have enough breastmilk to feed her baby so she started supplementing with formula. Reviewed hand expression and mother happy to see drops of colostrum.  Feed on demand with cues.  Goal 8-12+ times per day after first 24 hrs.  Provided education and encouraged mother to offer breat before offering formula to help establish her milk supply. Provided volume guidelines. Suggest calling for help as needed with latching.   Maternal Data Has patient been taught Hand Expression?: Yes Does the patient have breastfeeding experience prior to this delivery?: Yes (6 weeks)  Feeding Mother's Current Feeding Choice: Breast Milk and Formula Nipple Type: Slow - flow Lactation Tools Discussed/Used Tools: Pump Breast pump type: Double-Electric Breast Pump;Manual Pump Education: Setup, frequency, and cleaning;Milk Storage Reason for Pumping: stimulation Pumping frequency: when formula is given Pumped volume:  (drops)  Interventions Interventions: Hand express;Education;DEBP;LC Services brochure;Breast feeding basics reviewed  Discharge Pump: Personal;DEBP (Motif)  Consult Status Consult Status: Follow-up Date: 09/12/22 Follow-up type: In-patient    Alexandria Ross [facility] 09/11/2022, 10:59 AM

## 2022-09-11 NOTE — Progress Notes (Signed)
This RN contacted Dr Hyacinth Meeker from Anesthesia, Pt CBC resulted normal, Platelets in normal range. OK to remove epidural at this time.

## 2022-09-11 NOTE — Progress Notes (Signed)
Delivery Note At 2:18 AM a viable female was delivered via Vaginal, Spontaneous (Presentation: Middle Occiput Anterior).  APGAR: 8, 9; weight  .   Placenta status: Spontaneous, Intact.  Cord: 3 vessels with the following complications: None.  Cord pH: NA  Anesthesia: Epidural Episiotomy: None Lacerations: None Suture Repair:  NA Est. Blood Loss (mL): 201  Mom to postpartum.  Baby to Couplet care / Skin to Skin.  Hermina Staggers 09/11/2022, 2:35 AM

## 2022-09-11 NOTE — Discharge Summary (Signed)
Postpartum Discharge Summary  Date of Service updated***     Patient Name: Alexandria Ross DOB: Oct 13, 1990 MRN: [id number]  Date of admission: 09/09/2022 Delivery date:09/11/2022  Delivering provider: Hermina Staggers  Date of discharge: 09/11/2022  Admitting diagnosis: Gestational hypertension [O13.9] Intrauterine pregnancy: [redacted]w[redacted]d     Secondary diagnosis:  Principal Problem:   Vaginal delivery Active Problems:   Supervision of other normal pregnancy, antepartum  Additional problems: ***    Discharge diagnosis: {DX.:23714}                                              Post partum procedures:{Postpartum procedures:23558} Augmentation: AROM, Pitocin, and Cytotec Complications: None  Hospital course: Induction of Labor With Vaginal Delivery   32 y.o. yo G3P1011 at [redacted]w[redacted]d was admitted to the hospital 09/09/2022 for induction of labor.  Indication for induction: Gestational hypertension and A2 DM.  Patient had an labor course complicated by: recurrent FHR decels, following epidural, which improved following treatment. Membrane Rupture Time/Date: 1:55 PM ,09/10/2022   Delivery Method:Vaginal, Spontaneous  Episiotomy: None  Lacerations:  None  Details of delivery can be found in separate delivery note.  Patient had a postpartum course complicated by***. Patient is discharged home 09/11/22.  Newborn Data: Birth date:09/11/2022  Birth time:2:18 AM  Gender:Female  Living status:Living  Apgars:8 ,9  Weight:   Magnesium Sulfate received: No BMZ received: No Rhophylac:N/A MMR:N/A T-DaP:{Tdap:23962} Flu: {UVO:53664} Transfusion:{Transfusion received:30440034}  Physical exam  Vitals:   09/11/22 0031 09/11/22 0101 09/11/22 0131 09/11/22 0231  BP: (!) 139/91 125/87 (!) 133/95 (!) 139/91  Pulse: 86 94 99 (!) 113  Resp:      Temp:      TempSrc:      SpO2:       General: {Exam; general:21111117} Lochia: {Desc; appropriate/inappropriate:30686::"appropriate"} Uterine Fundus:  {Desc; firm/soft:30687} Incision: {Exam; incision:21111123} DVT Evaluation: {Exam; dvt:2111122} Labs: Lab Results  Component Value Date   WBC 11.3 (H) 09/09/2022   HGB 12.0 09/09/2022   HCT 35.7 (L) 09/09/2022   MCV 83.0 09/09/2022   PLT 253 09/09/2022      Latest Ref Rng & Units 09/09/2022   10:22 PM  CMP  Glucose 70 - 99 mg/dL 403   BUN 6 - 20 mg/dL <5   Creatinine 4.74 - 1.00 mg/dL 2.59   Sodium 563 - 875 mmol/L 135   Potassium 3.5 - 5.1 mmol/L 3.8   Chloride 98 - 111 mmol/L 104   CO2 22 - 32 mmol/L 21   Calcium 8.9 - 10.3 mg/dL 8.6   Total Protein 6.5 - 8.1 g/dL 6.0   Total Bilirubin 0.3 - 1.2 mg/dL 0.2   Alkaline Phos 38 - 126 U/L 102   AST 15 - 41 U/L 17   ALT 0 - 44 U/L 11    Edinburgh Score:     No data to display           After visit meds:  Allergies as of 09/11/2022   No Known Allergies   Med Rec must be completed prior to using this [facility]***        Discharge home in stable condition Infant Feeding: {Baby feeding:23562} Infant Disposition:{CHL IP OB HOME WITH IEPPIR:51884} Discharge instruction: per After Visit Summary and Postpartum booklet. Activity: Advance as tolerated. Pelvic rest for 6 weeks.  Diet: {OB ZYSA:63016010} Future Appointments:No future appointments. Follow up  Visit:  The following message was sent to [facility] by Gwenyth Allegra, MD  Please schedule this patient for a In person postpartum visit in 6 weeks with the following provider: Any provider. Additional Postpartum F/U:2 hour GTT and BP check 1 week  High risk pregnancy complicated by: GDM A2 and gestational hypertension. Delivery mode:  Vaginal, Spontaneous  Anticipated Birth Control:  Depo   09/11/2022 Alfredia Ferguson, MD

## 2022-09-12 MED ORDER — NIFEDIPINE ER OSMOTIC RELEASE 30 MG PO TB24: 30.0000 mg | ORAL_TABLET | Freq: Every day | ORAL | 1 refills | Status: DC

## 2022-09-12 MED ORDER — FUROSEMIDE 20 MG PO TABS
20.0000 mg | ORAL_TABLET | Freq: Every day | ORAL | Status: DC
  Administered 2022-09-12: 20 mg via ORAL
  Filled 2022-09-12: qty 1

## 2022-09-12 MED ORDER — FUROSEMIDE 20 MG PO TABS: 20.0000 mg | ORAL_TABLET | Freq: Two times a day (BID) | ORAL | 5 refills | Status: DC

## 2022-09-12 MED ORDER — IBUPROFEN 600 MG PO TABS: 600.0000 mg | ORAL_TABLET | Freq: Four times a day (QID) | ORAL | 0 refills | Status: DC

## 2022-09-12 NOTE — Progress Notes (Signed)
Post Partum Day 1 Subjective: Pt without complaints this morning. Ambulating, voiding, tolerating diet and good oral pain control  Objective: Blood pressure 134/78, pulse 80, temperature 97.9 F (36.6 C), temperature source Oral, resp. rate 16, last menstrual period 12/17/2021, SpO2 97 %, unknown if currently breastfeeding.  Physical Exam:  General: alert Lochia: appropriate Uterine Fundus: firm Incision: NA DVT Evaluation: No evidence of DVT seen on physical exam.  Recent Labs    09/11/22 0428 09/11/22 0525  HGB 11.3* 11.0*  HCT 33.8* 33.2*    Assessment/Plan: Plan for discharge tomorrow   LOS: 3 days   Hermina Staggers, MD 09/12/2022, 7:29 AM

## 2022-09-12 NOTE — Lactation Note (Signed)
This note was copied from a baby's chart. Lactation Consultation Note  Patient Name: Alexandria Ross Date: 09/12/2022 Age:34 hours Reason for consult: Follow-up assessment;Early term 11-38.6wks Mom stated BF going well that she BF the longest last feeding for 7 minutes. Mom is giving formula afterwards. Mom has DEBP at bedside and stated she is pumping but not getting anything yet. Explained normal to continue to do so for stimulation. Encouraged mom to call for latch assistance as needed.   Maternal Data    Feeding Mother's Current Feeding Choice: Breast Milk and Formula  LATCH Score                    Lactation Tools Discussed/Used    Interventions    Discharge    Consult Status Consult Status: Follow-up Date: 09/13/22 Follow-up type: In-patient    [name] 09/12/2022, 5:36 AM

## 2022-09-12 NOTE — Progress Notes (Signed)
Pt given discharge instructions and verbalized understanding. All questions answered.  

## 2022-09-12 NOTE — Anesthesia Postprocedure Evaluation (Signed)
Anesthesia Post Note  Patient: Alexandria Ross  Procedure(s) Performed: AN AD HOC LABOR EPIDURAL     Patient location during evaluation: Mother Baby Anesthesia Type: Epidural Level of consciousness: awake and alert and oriented Pain management: satisfactory to patient Vital Signs Assessment: post-procedure vital signs reviewed and stable Respiratory status: respiratory function stable Cardiovascular status: stable Postop Assessment: no headache, no backache, epidural receding, patient able to bend at knees, no signs of nausea or vomiting, adequate PO intake and able to ambulate Anesthetic complications: no   No notable events documented.  Last Vitals:  Vitals:   09/11/22 2116 09/12/22 0027  BP: 117/71 134/78  Pulse: 77 80  Resp: 18 16  Temp: 36.4 C 36.6 C  SpO2: 100% 97%    Last Pain:  Vitals:   09/12/22 0537  TempSrc:   PainSc: 0-No pain   Pain Goal:                   Alexandria Ross

## 2022-09-12 NOTE — Lactation Note (Signed)
This note was copied from a baby's chart. Lactation Consultation Note  Patient Name: Alexandria Ross WUJWJ'X Date: 09/12/2022 Age:34 hours Reason for consult: Follow-up assessment;Early term 37-38.6wks  P2, Encouraged breastfeeding on demand.  Reviewed engorgement care and monitoring voids/stools.  Maternal Data Does the patient have breastfeeding experience prior to this delivery?: Yes  Feeding Mother's Current Feeding Choice: Breast Milk and Formula Nipple Type: Slow - flow  Interventions Interventions: DEBP;Education  Discharge Discharge Education: Engorgement and breast care;Warning signs for feeding baby  Consult Status Consult Status: Complete Date: 09/12/22    [name] [facility] 09/12/2022, 11:16 AM

## 2022-09-15 ENCOUNTER — Ambulatory Visit (HOSPITAL_COMMUNITY): Payer: Self-pay

## 2022-09-15 NOTE — Lactation Note (Signed)
This note was copied from a baby's chart. Lactation Consultation Note  Patient Name: Alexandria Ross UJWJX'B Date: 09/15/2022 Age:34 days Reason for consult: Initial assessment;Hyperbilirubinemia Baby on Double Photo therapy.  Mom in good spirt. Mom is pumping 2 oz. Praised mom. Mom is giving BM after BF. Mom doesn't have any questions or needs at this time. Encouraged mom to call for anything or needs assistance. Mom states she is good at this time.  Maternal Data    Feeding Mother's Current Feeding Choice: Breast Milk  LATCH Score                    Lactation Tools Discussed/Used    Interventions    Discharge    Consult Status Consult Status: PRN Date: 09/16/22 Follow-up type: In-patient    [name] 09/15/2022, 10:34 PM

## 2022-09-21 NOTE — Progress Notes (Signed)
Alert received on 09/13/2022 through [facility] system as follows: Patient identified as having high BP reading (134/92) on 09/06/2022  Chart review: Blood pressure was rechecked on 09/12/2022. Recheck was 134/78.  No action needed. Patient delivered.   Alexandria Ross, CMA 09/21/2022  3:58 PM

## 2022-09-22 ENCOUNTER — Ambulatory Visit: Payer: Medicaid Other

## 2022-09-23 ENCOUNTER — Telehealth (HOSPITAL_COMMUNITY): Payer: Self-pay | Admitting: *Deleted

## 2022-09-23 NOTE — Telephone Encounter (Signed)
Attempted hospital discharge follow-up call. Left message for patient to return RN call with any questions or concerns. Deforest Hoyles, RN, 09/23/22, [phone]

## 2022-10-01 ENCOUNTER — Other Ambulatory Visit: Payer: Self-pay

## 2022-10-01 ENCOUNTER — Ambulatory Visit (INDEPENDENT_AMBULATORY_CARE_PROVIDER_SITE_OTHER): Payer: Medicaid Other | Admitting: General Practice

## 2022-10-01 ENCOUNTER — Encounter: Payer: Self-pay | Admitting: General Practice

## 2022-10-01 VITALS — BP 121/88 | HR 86 | Ht 67.0 in | Wt 239.0 lb

## 2022-10-01 DIAGNOSIS — Z0130 Encounter for examination of blood pressure without abnormal findings: Secondary | ICD-10-CM

## 2022-10-01 NOTE — Progress Notes (Signed)
Patient presents to office today for blood pressure check following up from vaginal delivery on 4/27- pregnancy complicated by [facility]. She reports doing well since then. Denies headaches, dizziness, blurred vision or swelling. She took several days of prescribed Lasix but then stopped. Patient has not taken Nifedipine prescription as she felt she didn't really need it- feels her elevated blood pressure reading in office was related to being upset about IOL. Reports blood pressures at home have been normal. BP in office 121/99 & 121/88. Recommended she take Nifedipine Rx as prescribed and answered patient's questions regarding concerns and necessity of medication. She will return in 1 week for repeat BP check.  Chase Caller RN BSN 10/01/22

## 2022-10-04 ENCOUNTER — Other Ambulatory Visit: Payer: Self-pay | Admitting: General Practice

## 2022-10-04 DIAGNOSIS — Z8632 Personal history of gestational diabetes: Secondary | ICD-10-CM

## 2022-10-05 ENCOUNTER — Other Ambulatory Visit: Payer: Medicaid Other

## 2022-10-08 ENCOUNTER — Ambulatory Visit: Payer: Medicaid Other

## 2022-10-19 ENCOUNTER — Ambulatory Visit: Payer: Medicaid Other | Admitting: Advanced Practice Midwife

## 2022-10-20 ENCOUNTER — Encounter: Payer: Self-pay | Admitting: Family Medicine

## 2022-11-08 ENCOUNTER — Other Ambulatory Visit: Payer: Self-pay

## 2022-11-08 ENCOUNTER — Encounter: Payer: Self-pay | Admitting: Family Medicine

## 2022-11-08 ENCOUNTER — Ambulatory Visit (INDEPENDENT_AMBULATORY_CARE_PROVIDER_SITE_OTHER): Payer: BC Managed Care – PPO | Admitting: Family Medicine

## 2022-11-08 VITALS — BP 116/82 | HR 74 | Ht 67.0 in | Wt 241.0 lb

## 2022-11-08 DIAGNOSIS — R87612 Low grade squamous intraepithelial lesion on cytologic smear of cervix (LGSIL): Secondary | ICD-10-CM

## 2022-11-08 DIAGNOSIS — Z3042 Encounter for surveillance of injectable contraceptive: Secondary | ICD-10-CM

## 2022-11-08 MED ORDER — MEDROXYPROGESTERONE ACETATE 150 MG/ML IM SUSY
150.0000 mg | PREFILLED_SYRINGE | Freq: Once | INTRAMUSCULAR | Status: AC
Start: 2022-11-08 — End: 2022-11-08
  Administered 2022-11-08: 150 mg via INTRAMUSCULAR

## 2022-11-08 NOTE — Progress Notes (Signed)
    Post Partum Visit Note  Alexandria Ross is a 32 y.o. G60P2012 female who presents for a postpartum visit. She is 7 weeks postpartum following a normal spontaneous vaginal delivery.  I have fully reviewed the prenatal and intrapartum course. The delivery was at 38 gestational weeks.  Anesthesia: epidural. Postpartum course has been great-- finding lots of joy with older daughter and being a girl mom. Baby is doing well. Baby is feeding by both breast and bottle - Gerber sooth pro . Bleeding no bleeding. Bowel function is normal. Bladder function is normal. Patient is not sexually active. Contraception method is Depo-Provera injections. Postpartum depression screening: negative.   The pregnancy intention screening data noted above was reviewed. Potential methods of contraception were discussed. The patient elected to proceed with No data recorded.    Health Maintenance Due  Topic Date Due   COVID-19 Vaccine (1) Never done   FOOT EXAM  Never done   OPHTHALMOLOGY EXAM  Never done    {Common ambulatory SmartLinks:19316}  Review of Systems {ros; complete:30496}  Objective:  Ht 5\' 7"  (1.702 m)   Wt 241 lb (109.3 kg)   LMP 10/27/2022   Breastfeeding No   BMI 37.75 kg/m    General:  {gen appearance:16600}   Breasts:  {desc; normal/abnormal/not indicated:14647}  Lungs: {lung exam:16931}  Heart:  {heart exam:5510}  Abdomen: {abdomen exam:16834}   Wound {Wound assessment:11097}  GU exam:  {desc; normal/abnormal/not indicated:14647}       Assessment:    There are no diagnoses linked to this encounter.  *** postpartum exam.   Plan:   Essential components of care per ACOG recommendations:  1.  Mood and well being: Patient with {gen negative/positive:315881} depression screening today. Reviewed local resources for support.  - Patient tobacco use? {tobacco use:25506}  - hx of drug use? {yes/no:25505}    2. Infant care and feeding:  -Patient currently breastmilk feeding?  {yes/no:25502}  -Social determinants of health (SDOH) reviewed in EPIC. No concerns***The following needs were identified***  3. Sexuality, contraception and birth spacing - Patient {DOES_DOES XBJ:47829} want a pregnancy in the next year.  Desired family size is {NUMBER 1-10:22536} children.  - Reviewed reproductive life planning. Reviewed contraceptive methods based on pt preferences and effectiveness.  Patient desired {Upstream End Methods:24109} today.   - Discussed birth spacing of 18 months  4. Sleep and fatigue -Encouraged family/partner/community support of 4 hrs of uninterrupted sleep to help with mood and fatigue  5. Physical Recovery  - Discussed patients delivery and complications. She describes her labor as {description:25511} - Patient had a {CHL AMB DELIVERY:[phone]}. Patient had a {laceration:25518} laceration. Perineal healing reviewed. Patient expressed understanding - Patient has urinary incontinence? {yes/no:25515} - Patient {ACTION; IS/IS FAO:13086578} safe to resume physical and sexual activity  6.  Health Maintenance - HM due items addressed {Yes or If no, why not?:20788} - Last pap smear  Diagnosis  Date Value Ref Range Status  06/04/2022 - Low grade squamous intraepithelial lesion (LSIL) (A)  Final  06/04/2022 - See comment (A)  Final   Pap smear {done:10129} at today's visit.  -Breast Cancer screening indicated? {indicated:25516}  7. Chronic Disease/Pregnancy Condition follow up: {Follow up:25499}  - PCP follow up  Judd Gaudier, CMA [facility], [facility]

## 2022-11-09 DIAGNOSIS — R87612 Low grade squamous intraepithelial lesion on cytologic smear of cervix (LGSIL): Secondary | ICD-10-CM | POA: Insufficient documentation

## 2023-01-24 ENCOUNTER — Ambulatory Visit: Payer: Medicaid Other

## 2023-01-31 ENCOUNTER — Ambulatory Visit (INDEPENDENT_AMBULATORY_CARE_PROVIDER_SITE_OTHER): Payer: Medicaid Other

## 2023-01-31 ENCOUNTER — Other Ambulatory Visit: Payer: Self-pay

## 2023-01-31 VITALS — BP 132/91 | HR 87 | Wt 252.0 lb

## 2023-01-31 DIAGNOSIS — Z3042 Encounter for surveillance of injectable contraceptive: Secondary | ICD-10-CM

## 2023-01-31 MED ORDER — MEDROXYPROGESTERONE ACETATE 150 MG/ML IM SUSY
150.0000 mg | PREFILLED_SYRINGE | Freq: Once | INTRAMUSCULAR | Status: AC
Start: 2023-01-31 — End: 2023-01-31
  Administered 2023-01-31: 150 mg via INTRAMUSCULAR

## 2023-01-31 NOTE — Progress Notes (Signed)
Alexandria Ross here for Depo-Provera Injection. Reports vaginal bleeding starting end of July and continued intermittently until last week. Describes as usually light enough to wear a small pad; occasionally needed tampon for part of the day. This is her second injection since restart postpartum; injection administered without complication. Patient will return in 3 months for next injection between 04/18/23 and 05/02/23. Next annual visit due June 2025.   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 Did not return following PP visit for colposcopy. Reviewed with pt who is agreeable to scheduling colposcopy during check out.  BP elevated today x 2. Patient states she is stressed today trying to get to her appointment. History of gestational hypertension but not currently on meds. Will reevaluate at colposcopy appt with provider.  Marjo Bicker, RN 01/31/2023

## 2023-03-10 ENCOUNTER — Ambulatory Visit: Payer: Medicaid Other | Admitting: Obstetrics and Gynecology

## 2023-03-25 ENCOUNTER — Encounter: Payer: Self-pay | Admitting: Family Medicine

## 2023-03-29 ENCOUNTER — Telehealth (INDEPENDENT_AMBULATORY_CARE_PROVIDER_SITE_OTHER): Payer: Medicaid Other | Admitting: Obstetrics and Gynecology

## 2023-03-29 DIAGNOSIS — N921 Excessive and frequent menstruation with irregular cycle: Secondary | ICD-10-CM

## 2023-03-29 NOTE — Progress Notes (Signed)
   Complete physical exam  Patient: Alexandria Ross   DOB: 03/06/2001   32 y.o. Female  MRN: [id number]  Subjective:    No chief complaint on file.   Alexandria Ross is a 32 y.o. female who presents today for a complete physical exam. She reports consuming a {diet types:17450} diet. {types:19826} She generally feels {DESC; WELL/FAIRLY WELL/POORLY:18703}. She reports sleeping {DESC; WELL/FAIRLY WELL/POORLY:18703}. She {does/does not:200015} have additional problems to discuss today.    Most recent fall risk assessment:    11/11/2021   10:42 AM  Fall Risk   Falls in the past year? 0  Number falls in past yr: 0  Injury with Fall? 0  Risk for fall due to : No Fall Risks  Follow up Falls evaluation completed     Most recent depression screenings:    11/11/2021   10:42 AM 10/02/2020   10:46 AM  PHQ 2/9 Scores  PHQ - 2 Score 0 0  PHQ- 9 Score 5     {VISON DENTAL STD PSA (Optional):27386}  {History (Optional):23778}  Patient Care Team: Jessup, Joy, NP as PCP - General (Nurse Practitioner)   Outpatient Medications Prior to Visit  Medication Sig   fluticasone (FLONASE) 50 MCG/ACT nasal spray Place 2 sprays into both nostrils in the morning and at bedtime. After 7 days, reduce to once daily.   norgestimate-ethinyl estradiol (SPRINTEC 28) 0.25-35 MG-MCG tablet Take 1 tablet by mouth daily.   Nystatin POWD Apply liberally to affected area 2 times per day   spironolactone (ALDACTONE) 100 MG tablet Take 1 tablet (100 mg total) by mouth daily.   No facility-administered medications prior to visit.    ROS        Objective:     There were no vitals taken for this visit. {Vitals History (Optional):23777}  Physical Exam   No results found for any visits on 12/17/21. {Show previous labs (optional):23779}    Assessment & Plan:    Routine Health Maintenance and Physical Exam  Immunization History  Administered Date(s) Administered   DTaP 05/20/1999, 07/16/1999,  09/24/1999, 06/09/2000, 12/24/2003   Hepatitis A 10/20/2007, 10/25/2008   Hepatitis B 03/07/1999, 04/14/1999, 09/24/1999   HiB (PRP-OMP) 05/20/1999, 07/16/1999, 09/24/1999, 06/09/2000   IPV 05/20/1999, 07/16/1999, 03/14/2000, 12/24/2003   Influenza,inj,Quad PF,6+ Mos 01/25/2014   Influenza-Unspecified 04/26/2012   MMR 03/14/2001, 12/24/2003   Meningococcal Polysaccharide 10/25/2011   Pneumococcal Conjugate-13 06/09/2000   Pneumococcal-Unspecified 09/24/1999, 12/08/1999   Tdap 10/25/2011   Varicella 03/14/2000, 10/20/2007    Health Maintenance  Topic Date Due   HIV Screening  Never done   Hepatitis C Screening  Never done   INFLUENZA VACCINE  12/15/2021   PAP-Cervical Cytology Screening  12/17/2021 (Originally 03/05/2020)   PAP SMEAR-Modifier  12/17/2021 (Originally 03/05/2020)   TETANUS/TDAP  12/17/2021 (Originally 10/24/2021)   HPV VACCINES  Discontinued   COVID-19 Vaccine  Discontinued    Discussed health benefits of physical activity, and encouraged her to engage in regular exercise appropriate for her age and condition.  Problem List Items Addressed This Visit   None Visit Diagnoses     Annual physical exam    -  Primary   Cervical cancer screening       Need for Tdap vaccination          No follow-ups on file.     Joy Jessup, NP   

## 2023-03-29 NOTE — Progress Notes (Signed)
Sent both phone and email links to patient. Called patient with no answer and no VM.  Judeth Cornfield, RNC

## 2023-04-11 ENCOUNTER — Encounter: Payer: Medicaid Other | Admitting: Family Medicine

## 2023-04-18 ENCOUNTER — Ambulatory Visit: Payer: Medicaid Other
